# Patient Record
Sex: Female | Born: 1975 | Race: White | Hispanic: No | Smoking: Never smoker
Health system: Southern US, Community
[De-identification: ages and names within clinical notes are randomized; demographics above are authoritative.]

## PROBLEM LIST (undated history)

## (undated) DIAGNOSIS — K219 Gastro-esophageal reflux disease without esophagitis: Secondary | ICD-10-CM

## (undated) DIAGNOSIS — E781 Pure hyperglyceridemia: Secondary | ICD-10-CM

## (undated) DIAGNOSIS — F419 Anxiety disorder, unspecified: Secondary | ICD-10-CM

## (undated) DIAGNOSIS — K589 Irritable bowel syndrome without diarrhea: Secondary | ICD-10-CM

## (undated) DIAGNOSIS — G43909 Migraine, unspecified, not intractable, without status migrainosus: Secondary | ICD-10-CM

## (undated) DIAGNOSIS — N390 Urinary tract infection, site not specified: Secondary | ICD-10-CM

## (undated) DIAGNOSIS — E782 Mixed hyperlipidemia: Secondary | ICD-10-CM

## (undated) DIAGNOSIS — K859 Acute pancreatitis without necrosis or infection, unspecified: Secondary | ICD-10-CM

## (undated) DIAGNOSIS — G2581 Restless legs syndrome: Secondary | ICD-10-CM

## (undated) DIAGNOSIS — D649 Anemia, unspecified: Secondary | ICD-10-CM

## (undated) DIAGNOSIS — R000 Tachycardia, unspecified: Secondary | ICD-10-CM

## (undated) DIAGNOSIS — E785 Hyperlipidemia, unspecified: Secondary | ICD-10-CM

## (undated) HISTORY — DX: Tachycardia, unspecified: R00.0

## (undated) HISTORY — DX: Mixed hyperlipidemia: E78.2

## (undated) HISTORY — DX: Hyperlipidemia, unspecified: E78.5

## (undated) HISTORY — DX: Migraine, unspecified, not intractable, without status migrainosus: G43.909

## (undated) HISTORY — DX: Gastro-esophageal reflux disease without esophagitis: K21.9

## (undated) HISTORY — DX: Pure hyperglyceridemia: E78.1

## (undated) HISTORY — DX: Urinary tract infection, site not specified: N39.0

## (undated) HISTORY — PX: OTHER SURGICAL HISTORY: SHX169

## (undated) HISTORY — DX: Anemia, unspecified: D64.9

## (undated) HISTORY — DX: Anxiety disorder, unspecified: F41.9

## (undated) HISTORY — PX: ANAL SPHINCTEROPLASTY: SUR1305

## (undated) HISTORY — DX: Restless legs syndrome: G25.81

## (undated) HISTORY — DX: Acute pancreatitis without necrosis or infection, unspecified: K85.90

## (undated) HISTORY — PX: TONSILLECTOMY: SUR1361

## (undated) HISTORY — PX: FOOT SURGERY: SHX648

## (undated) HISTORY — DX: Irritable bowel syndrome without diarrhea: K58.9

---

## 2003-07-23 HISTORY — PX: OTHER SURGICAL HISTORY: SHX169

## 2004-01-07 HISTORY — PX: PERINEOPLASTY: SHX2218

## 2008-02-19 HISTORY — PX: TONSILLECTOMY: SUR1361

## 2013-04-02 HISTORY — PX: ESOPHAGOGASTRODUODENOSCOPY: SHX1529

## 2013-05-21 HISTORY — PX: COLONOSCOPY: SHX174

## 2013-11-26 HISTORY — PX: OTHER SURGICAL HISTORY: SHX169

## 2015-10-09 DIAGNOSIS — N871 Moderate cervical dysplasia: Secondary | ICD-10-CM

## 2015-10-09 HISTORY — DX: Moderate cervical dysplasia: N87.1

## 2016-05-31 DIAGNOSIS — K429 Umbilical hernia without obstruction or gangrene: Secondary | ICD-10-CM | POA: Insufficient documentation

## 2016-05-31 DIAGNOSIS — Z6838 Body mass index (BMI) 38.0-38.9, adult: Secondary | ICD-10-CM | POA: Insufficient documentation

## 2016-05-31 DIAGNOSIS — K4090 Unilateral inguinal hernia, without obstruction or gangrene, not specified as recurrent: Secondary | ICD-10-CM

## 2016-05-31 DIAGNOSIS — Z6841 Body Mass Index (BMI) 40.0 and over, adult: Secondary | ICD-10-CM | POA: Insufficient documentation

## 2016-05-31 DIAGNOSIS — E669 Obesity, unspecified: Secondary | ICD-10-CM | POA: Insufficient documentation

## 2016-05-31 HISTORY — DX: Unilateral inguinal hernia, without obstruction or gangrene, not specified as recurrent: K40.90

## 2016-05-31 HISTORY — DX: Body mass index (BMI) 38.0-38.9, adult: Z68.38

## 2016-05-31 HISTORY — DX: Umbilical hernia without obstruction or gangrene: K42.9

## 2016-05-31 HISTORY — DX: Body Mass Index (BMI) 40.0 and over, adult: Z684

## 2016-06-30 HISTORY — PX: OTHER SURGICAL HISTORY: SHX169

## 2016-06-30 HISTORY — PX: UMBILICAL HERNIA REPAIR: SHX196

## 2016-06-30 HISTORY — PX: HERNIA REPAIR: SHX51

## 2017-10-26 ENCOUNTER — Telehealth: Payer: Self-pay | Admitting: Gastroenterology

## 2017-10-26 NOTE — Telephone Encounter (Signed)
Pl do  6 refills She is taking it once a day. Also please inquire about the gallbladder status - please make sure that she did not have cholecystectomy (

## 2017-10-26 NOTE — Telephone Encounter (Signed)
Would you like to give refills?  

## 2017-10-27 MED ORDER — ELUXADOLINE 100 MG PO TABS: 100.0000 mg | ORAL_TABLET | Freq: Every day | ORAL | 5 refills | Status: DC

## 2017-10-27 NOTE — Telephone Encounter (Signed)
Sent refill to patients pharmacy. Patient still has her gallbladder.

## 2017-11-22 NOTE — Telephone Encounter (Signed)
Patient states she spoke with pharmacy and medication viberzi was never sent to pharmacy. Pt would like it resent to [facility] and the prescription line is [phone].

## 2017-11-24 MED ORDER — ELUXADOLINE 100 MG PO TABS: 100.0000 mg | ORAL_TABLET | Freq: Every day | ORAL | 3 refills | Status: DC

## 2017-11-24 NOTE — Telephone Encounter (Signed)
Sent refills to patients pharmacy.  

## 2017-11-24 NOTE — Addendum Note (Signed)
Addended by: Karena Addison on: 11/24/2017 01:56 PM   Modules accepted: Orders

## 2017-12-01 ENCOUNTER — Telehealth: Payer: Self-pay | Admitting: Gastroenterology

## 2017-12-12 NOTE — Telephone Encounter (Signed)
Called in refill to patients pharmacy.

## 2018-04-27 ENCOUNTER — Encounter: Payer: Self-pay | Admitting: Gastroenterology

## 2018-05-15 ENCOUNTER — Encounter: Payer: Self-pay | Admitting: Gastroenterology

## 2018-06-16 ENCOUNTER — Encounter: Payer: Self-pay | Admitting: Gastroenterology

## 2018-06-28 ENCOUNTER — Ambulatory Visit: Payer: Commercial Managed Care - PPO | Admitting: Gastroenterology

## 2018-06-28 ENCOUNTER — Encounter: Payer: Self-pay | Admitting: Gastroenterology

## 2018-06-28 ENCOUNTER — Encounter (INDEPENDENT_AMBULATORY_CARE_PROVIDER_SITE_OTHER): Payer: Self-pay

## 2018-06-28 VITALS — BP 132/86 | HR 86 | Ht 68.0 in | Wt 231.4 lb

## 2018-06-28 DIAGNOSIS — Z8371 Family history of colonic polyps: Secondary | ICD-10-CM

## 2018-06-28 DIAGNOSIS — K580 Irritable bowel syndrome with diarrhea: Secondary | ICD-10-CM

## 2018-06-28 MED ORDER — ELUXADOLINE 100 MG PO TABS: 100.0000 mg | ORAL_TABLET | Freq: Two times a day (BID) | ORAL | 0 refills | Status: DC

## 2018-06-28 NOTE — Progress Notes (Signed)
Chief Complaint: FU  Referring Provider:  No ref. provider found      ASSESSMENT AND PLAN;   #1. Diarrhea - likely IBS, could be due to medications like magnesium.  Doubt exocrine pancreatic insufficiency. Neg random Bx for microscopic colitis 05/2013, neg SB Bx for celiac 03/2013  #2. FH of polyps (dad), requiring hemicolectomy at age 43.  Family history of colon cancer GM  #3. H/O Acute pancreatitis d/t hypertriglyceridemia (peak level 01/2017 3511). No DM.  #4.  Fatty liver with normal LFTs.  Plan: - Continiue crestor, fish oil, vascepa 1g po BID. - Encouraged her to continue losing weight gradually.  Has lost 30 pounds since March 2019 - Cut down mag to 200mg /day. Can add Ca 500mg /qd - Proceed with colonoscopy.  I have discussed the risks and benefits.  The risks including risk of perforation requiring laparotomy, bleeding after polypectomy requiring blood transfusions and risks of anesthesia/sedation were discussed.  Rare risks of missing colorectal neoplasms were also discussed.  - Stool for GI pathogen, fecal elastase, WBC. - Viberzi 100mg  po bid (samples given) - If still with problems, empiric trial of pancreatic enzymes or low-dose amitriptyline at night.   HPI:    Robin Reed is a 43 y.o. female  Was doing very well on Viberzi (GB still in) -until insurance company has denied any further approvals. Has been having bowel movements at the frequency of 3 to 4/day but occasionally would get constipated. Has lost over 30 pounds gradually by watching p.o. intake and exercising. No nausea, vomiting, heartburn, regurgitation, odynophagia or dysphagia. Has abdominal bloating Denies having any melena or hematochezia.  Brings in the labs-03/13/2018-hemoglobin 13.6, WBC count 7.7, platelets 287, BUN 15/creatinine 0.9, normal LFTs AST 22, ALT 18.  Triglycerides 399, TSH 4.07. February 2019 hemoglobin 13.5, normal liver function tests, triglycerides 609   Past Medical  History:  Diagnosis Date  . Anemia   . Anxiety   . Elevated cholesterol with high triglycerides   . GERD (gastroesophageal reflux disease)   . Hyperlipidemia   . IBS (irritable bowel syndrome)   . Pancreatitis   . Tachycardia   . UTI (urinary tract infection)     Past Surgical History:  Procedure Laterality Date  . ANAL SPHINCTEROPLASTY    . COLONOSCOPY  05/21/2013   Very minimal sigmoid diverticulosis. Small internal hemorrhoids. Otherwise normal colon to TI.   Marland Kitchen episiotomy repair  07/2003  . ESOPHAGOGASTRODUODENOSCOPY  04/02/2013   Mild gastrtiis. Otherwise normal EGD  . FOOT SURGERY Right   . HERNIA REPAIR  40/98/1191   umbilical and right inginual   . Leep surgery  11/26/2013  . Nasal Surgery    . Right Inguinal Hernia Repair    . Skin Graft mouth    . TONSILLECTOMY      Family History  Problem Relation Age of Onset  . Clotting disorder Mother   . Colonic polyp Father   . Colon cancer Paternal Grandmother   . Esophageal cancer Neg Hx   . Breast cancer Neg Hx     Social History   Tobacco Use  . Smoking status: Never Smoker  . Smokeless tobacco: Never Used  Substance Use Topics  . Alcohol use: Not Currently  . Drug use: Never    Current Outpatient Medications  Medication Sig Dispense Refill  . AMBULATORY NON FORMULARY MEDICATION 1 tablet daily. Colon Health probiotic    . aspirin 81 MG tablet Take 81 mg by mouth at bedtime.    Marland Kitchen  Coenzyme Q10 (COQ10) 100 MG CAPS Take 1 capsule by mouth daily.    . Cyanocobalamin (VITAMIN B-12 IJ) Inject 1,000 mcg as directed every 30 (thirty) days.    Marland Kitchen dexlansoprazole (DEXILANT) 60 MG capsule Take 60 mg by mouth every other day.    . Drospirenone-Ethinyl Estradiol (YAZ PO) Take 3 mg by mouth at bedtime.    Vanessa Kick Ethyl (VASCEPA) 1 g CAPS Take 1 capsule by mouth 2 (two) times daily.    . Loratadine (CLARITIN) 10 MG CAPS Take 1 capsule by mouth daily.    . Magnesium 500 MG TABS Take 500 mg by mouth at bedtime.     .  metoprolol tartrate (LOPRESSOR) 25 MG tablet Take 25 mg by mouth at bedtime.    . Multiple Vitamin (MULTIVITAMIN) tablet Take 1 tablet by mouth daily. With iron    . Red Yeast Rice 600 MG TABS Take 1 tablet by mouth 2 (two) times daily.    . rosuvastatin (CRESTOR) 40 MG tablet Take 40 mg by mouth at bedtime.    . topiramate (TOPAMAX) 50 MG tablet Take 50 mg by mouth at bedtime.    Marland Kitchen VITAMIN D, CHOLECALCIFEROL, PO Take 2,000 Units by mouth daily.    . Eluxadoline (VIBERZI) 100 MG TABS Take 1 tablet (100 mg total) by mouth daily. (Patient not taking: Reported on 06/28/2018) 90 tablet 3   No current facility-administered medications for this visit.     Not on File  Review of Systems:  Constitutional: Denies fever, chills, diaphoresis, appetite change and fatigue.  HEENT: Denies photophobia, eye pain, redness, hearing loss, ear pain, congestion, sore throat, rhinorrhea, sneezing, mouth sores, neck pain, neck stiffness and tinnitus.   Respiratory: Denies SOB, DOE, cough, chest tightness,  and wheezing.   Cardiovascular: Denies chest pain, palpitations and leg swelling.  Genitourinary: Denies dysuria, urgency, frequency, hematuria, flank pain and difficulty urinating.  Musculoskeletal: Denies myalgias, back pain, joint swelling, arthralgias and gait problem.  Skin: No rash.  Neurological: Denies dizziness, seizures, syncope, weakness, light-headedness, numbness and headaches.  Hematological: Denies adenopathy. Easy bruising, personal or family bleeding history  Psychiatric/Behavioral: Has anxiety or depression. Has trouble sleeping     Physical Exam:    BP 132/86   Pulse 86   Ht 5\' 8"  (1.727 m)   Wt 231 lb 6 oz (105 kg)   BMI 35.18 kg/m  Filed Weights   06/28/18 0838  Weight: 231 lb 6 oz (105 kg)   Constitutional:  Well-developed, in no acute distress. Psychiatric: Normal mood and affect. Behavior is normal. HEENT: Pupils normal.  Conjunctivae are normal. No scleral icterus. Neck  supple.  Cardiovascular: Normal rate, regular rhythm. No edema Pulmonary/chest: Effort normal and breath sounds normal. No wheezing, rales or rhonchi. Abdominal: Soft, nondistended. Nontender. Bowel sounds active throughout. There are no masses palpable. No hepatomegaly. Rectal:  defered Neurological: Alert and oriented to person place and time. Skin: Skin is warm and dry. No rashes noted. 25 minutes spent with the patient today. Greater than 50% was spent in counseling and coordination of care with the patient    Carmell Austria, MD 06/28/2018, 9:02 AM  Cc: Dr Jannette Fogo

## 2018-06-28 NOTE — Patient Instructions (Signed)
If you are age 43 or older, your body mass index should be between 23-30. Your Body mass index is 35.18 kg/m. If this is out of the aforementioned range listed, please consider follow up with your Primary Care Provider.  If you are age 12 or younger, your body mass index should be between 19-25. Your Body mass index is 35.18 kg/m. If this is out of the aformentioned range listed, please consider follow up with your Primary Care Provider.   You have been scheduled for a colonoscopy. Please follow written instructions given to you at your visit today.  Please pick up your prep supplies at the pharmacy within the next 1-3 days. If you use inhalers (even only as needed), please bring them with you on the day of your procedure. Your physician has requested that you go to www.startemmi.com and enter the access code given to you at your visit today. This web site gives a general overview about your procedure. However, you should still follow specific instructions given to you by our office regarding your preparation for the procedure.   Decrease magnesium.  Add Calcium once daily,   Please bring your stool specimen back to the lab on the 2nd floor [address].  Thank you,  Dr. Jackquline Denmark

## 2018-06-29 ENCOUNTER — Telehealth: Payer: Self-pay | Admitting: Gastroenterology

## 2018-06-29 NOTE — Telephone Encounter (Signed)
Called and spoke with patient-patient reports she "dropped off my stool sample at [facility] but I was not really sure what all the doctor wanted to test my stool for so I just gave them the order and told them to run whatever they could";  [facility] lab and clarified the orders for them to run the stool specimen for-they stated they did not know exactly what a "GI pathogen panel is for"; they did however have an order in their system for a stool PCR-don't know if that is the exact test the doctor is wanting;  Please be aware the stool sample results may not be exactly what the original order is specified for;

## 2018-06-29 NOTE — Telephone Encounter (Signed)
Bre Can you please make sure that they get GI pathogen PCR, C. Difficile antigen, cultures and WBCs if possible

## 2018-06-29 NOTE — Telephone Encounter (Signed)
Called and spoke with lab tech, Sharyn Lull, at [facility]-clarification of order given and Naome verbalized understanding of information and instructions; results were requested to be faxed to [phone]

## 2018-07-26 ENCOUNTER — Encounter: Payer: Self-pay | Admitting: Gastroenterology

## 2018-08-08 ENCOUNTER — Ambulatory Visit (AMBULATORY_SURGERY_CENTER): Payer: Commercial Managed Care - PPO | Admitting: Gastroenterology

## 2018-08-08 ENCOUNTER — Encounter: Payer: Self-pay | Admitting: Gastroenterology

## 2018-08-08 VITALS — BP 151/89 | HR 77 | Temp 99.1°F | Resp 16 | Ht 68.0 in | Wt 231.0 lb

## 2018-08-08 DIAGNOSIS — K589 Irritable bowel syndrome without diarrhea: Secondary | ICD-10-CM | POA: Diagnosis not present

## 2018-08-08 DIAGNOSIS — D123 Benign neoplasm of transverse colon: Secondary | ICD-10-CM

## 2018-08-08 DIAGNOSIS — R197 Diarrhea, unspecified: Secondary | ICD-10-CM

## 2018-08-08 DIAGNOSIS — D126 Benign neoplasm of colon, unspecified: Secondary | ICD-10-CM

## 2018-08-08 MED ORDER — SODIUM CHLORIDE 0.9 % IV SOLN: 500.0000 mL | Freq: Once | INTRAVENOUS | Status: DC

## 2018-08-08 NOTE — Progress Notes (Signed)
Report to PACU, RN, vss, BBS= Clear.  

## 2018-08-08 NOTE — Op Note (Signed)
[facility] Patient Name: Robin Reed Procedure Date: 08/08/2018 8:01 AM MRN: [id number] Endoscopist: Jackquline Denmark , MD Age: 43 Referring MD:  Date of Birth: 16-Aug-1975 Gender: Female Account #: [id number] Procedure:                Colonoscopy Indications:              1. Diarrhea. 2. Family history of colonic polyps Medicines:                Monitored Anesthesia Care Procedure:                Pre-Anesthesia Assessment:                           - Prior to the procedure, a History and Physical                            was performed, and patient medications and                            allergies were reviewed. The patient's tolerance of                            previous anesthesia was also reviewed. The risks                            and benefits of the procedure and the sedation                            options and risks were discussed with the patient.                            All questions were answered, and informed consent                            was obtained. Prior Anticoagulants: The patient has                            taken no previous anticoagulant or antiplatelet                            agents. ASA Grade Assessment: I - A normal, healthy                            patient. After reviewing the risks and benefits,                            the patient was deemed in satisfactory condition to                            undergo the procedure.                           After obtaining informed consent, the colonoscope  was passed under direct vision. Throughout the                            procedure, the patient's blood pressure, pulse, and                            oxygen saturations were monitored continuously. The                            Colonoscope was introduced through the anus and                            advanced to the 4 cm into the ileum. The                            colonoscopy was performed without  difficulty. The                            patient tolerated the procedure well. The quality                            of the bowel preparation was excellent. The colon                            was somewhat redundant. Scope In: 8:05:57 AM Scope Out: 8:22:43 AM Scope Withdrawal Time: 0 hours 12 minutes 8 seconds  Total Procedure Duration: 0 hours 16 minutes 46 seconds  Findings:                 A 10 mm polyp was found in the distal transverse                            colon. The polyp was semi-pedunculated. The polyp                            was removed with a hot snare. Resection and                            retrieval were complete. Estimated blood loss:                            none. Biopsies for histology were taken with a cold                            forceps for evaluation of microscopic colitis.                            Estimated blood loss: none.                           Non-bleeding internal hemorrhoids were found during                            retroflexion. The hemorrhoids were small.  The terminal ileum appeared normal. Biopsies were                            taken with a cold forceps for histology. Estimated                            blood loss: none.                           The exam was otherwise without abnormality on                            direct and retroflexion views. Complications:            No immediate complications. Estimated Blood Loss:     Estimated blood loss: none. Impression:               -Colonic polyp status post polypectomy.                           -Small internal hemorrhoids.                           -Otherwise normal colonoscopy to TI. The colon was                            somewhat redundant. Recommendation:           - Patient has a contact number available for                            emergencies. The signs and symptoms of potential                            delayed complications were discussed  with the                            patient. Return to normal activities tomorrow.                            Written discharge instructions were provided to the                            patient.                           - Resume previous diet.                           - Continue present medications.                           - Await pathology results.                           - Repeat colonoscopy for surveillance based on  pathology results.                           - Return to GI clinic in 12 weeks. Jackquline Denmark, MD 08/08/2018 8:27:34 AM This report has been signed electronically.

## 2018-08-09 ENCOUNTER — Telehealth: Payer: Self-pay | Admitting: *Deleted

## 2018-08-09 NOTE — Telephone Encounter (Signed)
  Follow up Call-  Call back number 08/08/2018  Post procedure Call Back phone  # [phone]  Permission to leave phone message Yes     Patient questions:  Do you have a fever, pain , or abdominal swelling? No. Pain Score  0 *  Have you tolerated food without any problems? Yes.    Have you been able to return to your normal activities? Yes.    Do you have any questions about your discharge instructions: Diet   No. Medications  No. Follow up visit  No.  Do you have questions or concerns about your Care? No.  Actions: * If pain score is 4 or above: No action needed, pain <4.

## 2018-08-09 NOTE — Patient Instructions (Signed)
Handout on polyps and hemorrhoids given.  Resume previous medications.     YOU HAD AN ENDOSCOPIC PROCEDURE TODAY AT [facility]:   Refer to the procedure report that was given to you for any specific questions about what was found during the examination.  If the procedure report does not answer your questions, please call your gastroenterologist to clarify.  If you requested that your care partner not be given the details of your procedure findings, then the procedure report has been included in a sealed envelope for you to review at your convenience later.  YOU SHOULD EXPECT: Some feelings of bloating in the abdomen. Passage of more gas than usual.  Walking can help get rid of the air that was put into your GI tract during the procedure and reduce the bloating. If you had a lower endoscopy (such as a colonoscopy or flexible sigmoidoscopy) you may notice spotting of blood in your stool or on the toilet paper. If you underwent a bowel prep for your procedure, you may not have a normal bowel movement for a few days.  Please Note:  You might notice some irritation and congestion in your nose or some drainage.  This is from the oxygen used during your procedure.  There is no need for concern and it should clear up in a day or so.  SYMPTOMS TO REPORT IMMEDIATELY:   Following lower endoscopy (colonoscopy or flexible sigmoidoscopy):  Excessive amounts of blood in the stool  Significant tenderness or worsening of abdominal pains  Swelling of the abdomen that is new, acute  Fever of 100F or higher   For urgent or emergent issues, a gastroenterologist can be reached at any hour by calling [phone].   DIET:  We do recommend a small meal at first, but then you may proceed to your regular diet.  Drink plenty of fluids but you should avoid alcoholic beverages for 24 hours.  ACTIVITY:  You should plan to take it easy for the rest of today and you should NOT DRIVE or use heavy  machinery until tomorrow (because of the sedation medicines used during the test).    FOLLOW UP: Our staff will call the number listed on your records the next business day following your procedure to check on you and address any questions or concerns that you may have regarding the information given to you following your procedure. If we do not reach you, we will leave a message.  However, if you are feeling well and you are not experiencing any problems, there is no need to return our call.  We will assume that you have returned to your regular daily activities without incident.  If any biopsies were taken you will be contacted by phone or by letter within the next 1-3 weeks.  Please call us at [phone] if you have not heard about the biopsies in 3 weeks.    SIGNATURES/CONFIDENTIALITY: You and/or your care partner have signed paperwork which will be entered into your electronic medical record.  These signatures attest to the fact that that the information above on your After Visit Summary has been reviewed and is understood.  Full responsibility of the confidentiality of this discharge information lies with you and/or your care-partner.

## 2018-08-11 ENCOUNTER — Encounter: Payer: Self-pay | Admitting: Gastroenterology

## 2018-10-03 ENCOUNTER — Encounter: Payer: Self-pay | Admitting: Gastroenterology

## 2018-10-03 ENCOUNTER — Other Ambulatory Visit: Payer: Self-pay

## 2018-10-03 ENCOUNTER — Telehealth (INDEPENDENT_AMBULATORY_CARE_PROVIDER_SITE_OTHER): Payer: Commercial Managed Care - PPO | Admitting: Gastroenterology

## 2018-10-03 VITALS — Ht 68.0 in | Wt 221.8 lb

## 2018-10-03 DIAGNOSIS — K760 Fatty (change of) liver, not elsewhere classified: Secondary | ICD-10-CM

## 2018-10-03 DIAGNOSIS — Z8719 Personal history of other diseases of the digestive system: Secondary | ICD-10-CM | POA: Diagnosis not present

## 2018-10-03 DIAGNOSIS — R197 Diarrhea, unspecified: Secondary | ICD-10-CM | POA: Diagnosis not present

## 2018-10-03 DIAGNOSIS — D126 Benign neoplasm of colon, unspecified: Secondary | ICD-10-CM | POA: Diagnosis not present

## 2018-10-03 MED ORDER — AMITRIPTYLINE HCL 25 MG PO TABS: 25.0000 mg | ORAL_TABLET | Freq: Every evening | ORAL | 3 refills | Status: DC | PRN

## 2018-10-03 NOTE — Progress Notes (Signed)
Chief Complaint: FU  Referring Provider:  Raelyn Number, MD      ASSESSMENT AND PLAN;   #1. Diarrhea - likely IBS, could be due to medications like magnesium.  Doubt exocrine pancreatic insufficiency. Neg random Bx for microscopic colitis 07/2018, neg SB Bx for celiac 03/2013  #2. H/O TA (colon 07/2016)FH of polyps (dad), requiring hemicolectomy at age 43.  Family history of colon cancer GM  #3. H/O Acute pancreatitis d/t hypertriglyceridemia (peak level 01/2017 3511). No DM.  #4.  Fatty liver with normal LFTs.  Plan: - Continiue crestor, fish oil, vascepa 1g po BID. - Encouraged her to continue losing weight gradually as she has been doing. - FU colonoscopy 07/2021.  Earlier, in case of any red flag symptoms. - Viberzi 100mg  po bid (samples given). We had 75mg  samples. Will mail d/t covid. - low-dose amitriptyline 25mg  po qhs (30, 2 refills) at night.  It will help to sleep as well. - She is to call us in 2 weeks to let us know.    HPI:    Robin Reed is a 43 y.o. female  For follow-up visit Works in [place] at [employer]  Had diarrhea when she gets upset and has anxiety.   Otherwise has been doing better Has lost more wt Was doing very well on Viberzi (GB still in) -until [employer] has denied any further approvals.  It was horribly expensive as well.  She would like [place] to give samples if possible. Has been having bowel movements at the frequency of 3 to 4/day but occasionally would get constipated. Has lost over 15 more pounds gradually by watching p.o. intake and exercising. No nausea, vomiting, heartburn, regurgitation, odynophagia or dysphagia. Has abdominal bloating Denies having any melena or hematochezia.  Most recent triglycerides were 400.  No further pancreatitis.  Previous labs:2019-hemoglobin 13.6, WBC count 7.7, platelets 287, BUN 15/creatinine 0.9, normal LFTs AST 22, ALT 18.  Triglycerides 399, TSH 4.07. February 2019 hemoglobin 13.5,  normal liver function tests, triglycerides 609   Past Medical History:  Diagnosis Date  . Anemia   . Anxiety   . Elevated cholesterol with high triglycerides   . GERD (gastroesophageal reflux disease)   . Hyperlipidemia   . IBS (irritable bowel syndrome)   . Pancreatitis   . Tachycardia   . UTI (urinary tract infection)     Past Surgical History:  Procedure Laterality Date  . ANAL SPHINCTEROPLASTY    . COLONOSCOPY  05/21/2013   Very minimal sigmoid diverticulosis. Small internal hemorrhoids. Otherwise normal colon to TI.   Marland Kitchen episiotomy repair  07/2003  . ESOPHAGOGASTRODUODENOSCOPY  04/02/2013   Mild gastrtiis. Otherwise normal EGD  . FOOT SURGERY Right   . HERNIA REPAIR  28/41/3244   umbilical and right inginual   . Leep surgery  11/26/2013  . Nasal Surgery    . Right Inguinal Hernia Repair    . Skin Graft mouth    . TONSILLECTOMY      Family History  Problem Relation Age of Onset  . Clotting disorder Mother   . Colonic polyp Father   . Colon cancer Paternal Grandmother   . Esophageal cancer Neg Hx   . Breast cancer Neg Hx     Social History   Tobacco Use  . Smoking status: Never Smoker  . Smokeless tobacco: Never Used  Substance Use Topics  . Alcohol use: Not Currently  . Drug use: Never    Current Outpatient Medications  Medication Sig  Dispense Refill  . AMBULATORY NON FORMULARY MEDICATION 1 tablet daily. Colon Health probiotic    . aspirin 81 MG tablet Take 81 mg by mouth at bedtime.    . Coenzyme Q10 (COQ10) 100 MG CAPS Take 1 capsule by mouth daily.    . Cyanocobalamin (VITAMIN B-12 IJ) Inject 1,000 mcg as directed every 30 (thirty) days.    Marland Kitchen dexlansoprazole (DEXILANT) 60 MG capsule Take 60 mg by mouth every other day.    . Drospirenone-Ethinyl Estradiol (YAZ PO) Take 3 mg by mouth at bedtime.    . Eluxadoline (VIBERZI) 100 MG TABS Take 1 tablet (100 mg total) by mouth daily. 90 tablet 3  . Icosapent Ethyl (VASCEPA) 1 g CAPS Take 1 capsule by mouth  2 (two) times daily.    . Loratadine (CLARITIN) 10 MG CAPS Take 1 capsule by mouth daily.    . Magnesium 500 MG TABS Take 250 mg by mouth at bedtime.     . metoprolol tartrate (LOPRESSOR) 25 MG tablet Take 25 mg by mouth at bedtime.    . Multiple Vitamin (MULTIVITAMIN) tablet Take 1 tablet by mouth daily. With iron    . Omega-3 Fatty Acids (FISH OIL) 1000 MG CAPS Take 2 capsules by mouth 2 (two) times daily.    . Red Yeast Rice 600 MG TABS Take 1 tablet by mouth 2 (two) times daily.    . rosuvastatin (CRESTOR) 40 MG tablet Take 40 mg by mouth at bedtime.    . topiramate (TOPAMAX) 50 MG tablet Take 50 mg by mouth at bedtime.    Marland Kitchen VITAMIN D, CHOLECALCIFEROL, PO Take 2,000 Units by mouth daily.     No current facility-administered medications for this visit.     No Known Allergies  Review of Systems:       Physical Exam:    Ht 5\' 8"  (1.727 m)   Wt 221 lb 12.8 oz (100.6 kg)   BMI 33.72 kg/m  Filed Weights   10/03/18 1334  Weight: 221 lb 12.8 oz (100.6 kg)  Not examined since it was a tele-visit.  This service was provided via telemedicine.  The patient was located at home.  The provider was located in office.  The patient did consent to this telephone visit and is aware of possible charges through their insurance for this visit.   Time spent on call: 15 min    Carmell Austria, MD 10/03/2018, 2:28 PM  Cc: Dr Jannette Fogo

## 2018-10-03 NOTE — Patient Instructions (Addendum)
Continiue crestor, fish oil, vascepa 1g po BID   Encouraged her to continue losing weight gradually.  Has lost 30 pounds since March 2019   FU colonoscopy 07/2021   Viberzi 100mg  po bid (samples given). We had 75mg  samples   low-dose amitriptyline 25mg  po qhs (30, 2 refills) at night ( medication sent to pharmacy)  Call us in 2 weeks to let us know how you are  doing    Thank You for choosing [facility]   Dr Lyndel Safe

## 2019-06-27 ENCOUNTER — Other Ambulatory Visit: Payer: Self-pay | Admitting: Gastroenterology

## 2019-06-27 DIAGNOSIS — K580 Irritable bowel syndrome with diarrhea: Secondary | ICD-10-CM

## 2019-06-27 DIAGNOSIS — Z8371 Family history of colonic polyps: Secondary | ICD-10-CM

## 2020-05-14 ENCOUNTER — Ambulatory Visit: Payer: Commercial Managed Care - PPO | Admitting: Gastroenterology

## 2020-05-14 ENCOUNTER — Encounter: Payer: Self-pay | Admitting: Gastroenterology

## 2020-05-14 VITALS — BP 138/80 | HR 83 | Ht 68.0 in | Wt 238.4 lb

## 2020-05-14 DIAGNOSIS — K580 Irritable bowel syndrome with diarrhea: Secondary | ICD-10-CM

## 2020-05-14 DIAGNOSIS — K219 Gastro-esophageal reflux disease without esophagitis: Secondary | ICD-10-CM

## 2020-05-14 NOTE — Progress Notes (Signed)
Chief Complaint: FU  Referring Provider:  Bonnita Nasuti, MD      ASSESSMENT AND PLAN;   #1. Diarrhea - likely IBS, could be due to medications like magnesium.  Doubt exocrine pancreatic insufficiency. Neg random Bx for microscopic colitis 07/2018, neg SB Bx for celiac 03/2013. Viberzi helped but INS stopped paying.  #2. H/O TA (colon 07/2016)FH of polyps (dad), requiring hemicolectomy at age 44.  Family history of colon cancer GM  #3. H/O Acute pancreatitis d/t hypertriglyceridemia (peak level 01/2017 3511). No DM.  #4.  GERD  #5.  Fatty liver with normal LFTs.  Plan: - Continiue crestor, fish oil, vascepa 1g po BID. - Change Dexilant to Protonix $RemoveBef'40mg'pPdNqucHva$  po QD #30, 11 refills (due to insurance reasons) - Encouraged her to continue losing weight gradually as she has been doing. - FU colonoscopy 07/2021.  Earlier, in case of any red flag symptoms    HPI:    Robin Reed is a 44 y.o. female  For follow-up visit Doing well from GI standpoint.  Got denial for Dexilant from INS company-we will try to fill patient assistance forms.  She would like [place] to give prescription for Protonix as well.  Unfortunately, her dad has been diagnosed as having metastatic squamous cell carcinoma of head and neck area.  He is not responding well to chemotherapy.  He has history of heavy smoking.  C/O Had diarrhea when she gets upset and has anxiety.    Was doing very well on Viberzi (GB still in) -until [employer] has denied any further approvals.  It was horribly expensive as well.   Currently having BMs 1-3/day without nocturnal symptoms.  She would occasionally get constipated.  No abdominal pain.  No melena or hematochezia.  Most recent triglycerides were 217.  No further pancreatitis.  She brings in the latest labs from 04/21/2020 showing hemoglobin 13.8, MCV 90, platelets 267.  Her liver tests are all within normal limits with AST 19, a LT 17, alk phos 43.  Her most recent  triglycerides were 217 with normal hemoglobin A1c 5.4, TSH 2.87, B12 362.  Previous labs:2019-hemoglobin 13.6, WBC count 7.7, platelets 287, BUN 15/creatinine 0.9, normal LFTs AST 22, ALT 18.  Triglycerides 399, TSH 4.07. February 2019 hemoglobin 13.5, normal liver function tests, triglycerides 609  Wt Readings from Last 3 Encounters:  05/14/20 238 lb 6 oz (108.1 kg)  10/03/18 221 lb 12.8 oz (100.6 kg)  08/08/18 231 lb (104.8 kg)    Past Medical History:  Diagnosis Date  . Anemia   . Anxiety   . Elevated cholesterol with high triglycerides   . GERD (gastroesophageal reflux disease)   . Hyperlipidemia   . IBS (irritable bowel syndrome)   . Pancreatitis   . Tachycardia   . UTI (urinary tract infection)     Past Surgical History:  Procedure Laterality Date  . ANAL SPHINCTEROPLASTY    . COLONOSCOPY  05/21/2013   Very minimal sigmoid diverticulosis. Small internal hemorrhoids. Otherwise normal colon to TI.   Marland Kitchen episiotomy repair  07/2003  . ESOPHAGOGASTRODUODENOSCOPY  04/02/2013   Mild gastrtiis. Otherwise normal EGD  . FOOT SURGERY Right   . HERNIA REPAIR  85/46/2703   umbilical and right inginual   . Leep surgery  11/26/2013  . Nasal Surgery    . Right Inguinal Hernia Repair    . Skin Graft mouth    . TONSILLECTOMY      Family History  Problem Relation Age of Onset  .  Clotting disorder Mother   . Colonic polyp Father   . Lung cancer Father   . Throat cancer Father   . Cancer Father        Neck   . Colon cancer Paternal Grandmother   . Esophageal cancer Neg Hx   . Breast cancer Neg Hx     Social History   Tobacco Use  . Smoking status: Never Smoker  . Smokeless tobacco: Never Used  Vaping Use  . Vaping Use: Never used  Substance Use Topics  . Alcohol use: Not Currently  . Drug use: Never    Current Outpatient Medications  Medication Sig Dispense Refill  . aspirin 81 MG tablet Take 81 mg by mouth at bedtime.    . Coenzyme Q10 (COQ10) 100 MG CAPS Take 1  capsule by mouth daily.    . cyanocobalamin (,VITAMIN B-12,) 1000 MCG/ML injection Inject 1,000 mcg into the muscle every 30 (thirty) days.    . Cyanocobalamin (VITAMIN B-12 IJ) Inject 1,000 mcg as directed every 30 (thirty) days.    Marland Kitchen dexlansoprazole (DEXILANT) 60 MG capsule Take 60 mg by mouth every other day.    . Drospirenone-Ethinyl Estradiol (YAZ PO) Take 3 mg by mouth at bedtime.    . liraglutide (VICTOZA) 18 MG/3ML SOPN Inject 18 mg into the skin daily. For weight loss started in April 2021    . Loratadine (CLARITIN) 10 MG CAPS Take 1 capsule by mouth daily.    . Magnesium 500 MG TABS Take 250 mg by mouth at bedtime.     . metoprolol tartrate (LOPRESSOR) 25 MG tablet Take 25 mg by mouth at bedtime.    . Multiple Vitamin (MULTIVITAMIN) tablet Take 1 tablet by mouth daily. With iron    . Omega-3 Fatty Acids (FISH OIL) 1000 MG CAPS Take 2 capsules by mouth 2 (two) times daily.    . Potassium 99 MG TABS Take 1 tablet by mouth daily.    . Red Yeast Rice 600 MG TABS Take 1 tablet by mouth 2 (two) times daily.    . rosuvastatin (CRESTOR) 40 MG tablet Take 40 mg by mouth at bedtime.    . topiramate (TOPAMAX) 50 MG tablet Take 50 mg by mouth at bedtime.    Marland Kitchen VITAMIN D, CHOLECALCIFEROL, PO Take 2,000 Units by mouth daily.     No current facility-administered medications for this visit.    No Known Allergies  Review of Systems:       Physical Exam:    BP 138/80   Pulse 83   Ht $R'5\' 8"'Cz$  (1.727 m)   Wt 238 lb 6 oz (108.1 kg)   BMI 36.24 kg/m  Filed Weights   05/14/20 1050  Weight: 238 lb 6 oz (108.1 kg)  Gen: awake, alert, NAD HEENT: anicteric, no pallor CV: RRR, no mrg Pulm: CTA b/l Abd: soft, NT/ND, +BS throughout Ext: no c/c/e Neuro: nonfocal     Carmell Austria, MD 05/14/2020, 11:01 AM  Cc: Dr Jannette Fogo

## 2020-05-14 NOTE — Patient Instructions (Signed)
If you are age 44 or older, your body mass index should be between 23-30. Your Body mass index is 36.24 kg/m. If this is out of the aforementioned range listed, please consider follow up with your Primary Care Provider.  If you are age 11 or younger, your body mass index should be between 19-25. Your Body mass index is 36.24 kg/m. If this is out of the aformentioned range listed, please consider follow up with your Primary Care Provider.   I have given you the patient assistance form for Dexilant, please complete and send back.   Thank you,  Dr. Jackquline Denmark

## 2020-12-05 DIAGNOSIS — R079 Chest pain, unspecified: Secondary | ICD-10-CM | POA: Diagnosis not present

## 2021-01-05 DIAGNOSIS — F419 Anxiety disorder, unspecified: Secondary | ICD-10-CM | POA: Insufficient documentation

## 2021-01-05 DIAGNOSIS — E782 Mixed hyperlipidemia: Secondary | ICD-10-CM | POA: Insufficient documentation

## 2021-01-05 DIAGNOSIS — K859 Acute pancreatitis without necrosis or infection, unspecified: Secondary | ICD-10-CM | POA: Insufficient documentation

## 2021-01-05 DIAGNOSIS — D649 Anemia, unspecified: Secondary | ICD-10-CM | POA: Insufficient documentation

## 2021-01-05 DIAGNOSIS — K219 Gastro-esophageal reflux disease without esophagitis: Secondary | ICD-10-CM | POA: Insufficient documentation

## 2021-01-05 DIAGNOSIS — K580 Irritable bowel syndrome with diarrhea: Secondary | ICD-10-CM

## 2021-01-05 DIAGNOSIS — R000 Tachycardia, unspecified: Secondary | ICD-10-CM | POA: Insufficient documentation

## 2021-01-05 DIAGNOSIS — K589 Irritable bowel syndrome without diarrhea: Secondary | ICD-10-CM | POA: Insufficient documentation

## 2021-01-05 DIAGNOSIS — E785 Hyperlipidemia, unspecified: Secondary | ICD-10-CM | POA: Insufficient documentation

## 2021-01-05 DIAGNOSIS — N390 Urinary tract infection, site not specified: Secondary | ICD-10-CM | POA: Insufficient documentation

## 2021-01-05 HISTORY — DX: Gastro-esophageal reflux disease without esophagitis: K21.9

## 2021-01-05 HISTORY — DX: Irritable bowel syndrome with diarrhea: K58.0

## 2021-01-18 NOTE — Progress Notes (Signed)
Cardiology Office Note:    Date:  01/19/2021   ID:  Robin Reed, DOB June 01, 1976, MRN [id number]  PCP:  Laverle Hobby, NP  Cardiologist:  Shirlee More, MD   Referring MD: Laverle Hobby, NP  ASSESSMENT:    1. Chest pain of uncertain etiology   2. Elevated cholesterol with high triglycerides    PLAN:    In order of problems listed above:  Improved but ongoing continue PPI check cardiac CTA.  Is well controlled on her high intensity statin She has severe hypertriglyceridemia has had pancreatitis associated as well controlled on rosuvastatin and fish oil  Next appointment as needed if her cardiac CTA is normal   Medication Adjustments/Labs and Tests Ordered: Current medicines are reviewed at length with the patient today.  Concerns regarding medicines are outlined above.  No orders of the defined types were placed in this encounter.  No orders of the defined types were placed in this encounter.    Chief Complaint  Patient presents with   Hospitalization Follow-up    11/2020/ chest pain and L arm heaviness     History of Present Illness:    Robin Reed is a 45 y.o. female with a history of hyperlipidemia who is being seen today for the evaluation of chest pain at the request of Laverle Hobby, NP.  She had echocardiogram performed at [facility] 12/05/2020 interpreted by me showed normal left ventricular size function EF 60 to 65% and normal diastolic filling pressure the study was quite technically difficult no significant valvular abnormality was seen.  She has a history of pancreatitis associated with elevated triglycerides level 3511 and hepatic steatosis and has been treated with a high intensity statin and fish oil products.  Lipid profile 04/21/2020 cholesterol 131 LDL 10.6 triglycerides 217.  Review of epic Care Everywhere show no cardiology evaluation or diagnostic testing.  She works in the [employer] and is known to me I  had seen her in June when she had severe chest pain nonexertional precordial unrelated to activity unrelated with rest her EKG was normal and we did an echocardiogram that was normal.  At that time we discussed further evaluation placement of PPI which is helped but she continues to have chest discomfort that occurs intermittently brief nonexertional I will go ahead and do cardiac CTA. No known history of heart disease congenital rheumatic is not having shortness of breath palpitation or syncope Past Medical History:  Diagnosis Date   Anemia    Anxiety    Elevated cholesterol with high triglycerides    GERD (gastroesophageal reflux disease)    Hyperlipidemia    IBS (irritable bowel syndrome)    Pancreatitis    Tachycardia    UTI (urinary tract infection)     Past Surgical History:  Procedure Laterality Date   ANAL SPHINCTEROPLASTY     COLONOSCOPY  05/21/2013   Very minimal sigmoid diverticulosis. Small internal hemorrhoids. Otherwise normal colon to TI.    episiotomy repair  07/2003   ESOPHAGOGASTRODUODENOSCOPY  04/02/2013   Mild gastrtiis. Otherwise normal EGD   FOOT SURGERY Right    HERNIA REPAIR  99991111   umbilical and right inginual    Leep surgery  11/26/2013   Nasal Surgery     Right Inguinal Hernia Repair     Skin Graft mouth     TONSILLECTOMY      Current Medications: Current Meds  Medication Sig   aspirin 81 MG tablet Take 81 mg by mouth at bedtime.  Coenzyme Q10 (COQ10) 100 MG CAPS Take 1 capsule by mouth daily.   cyanocobalamin (,VITAMIN B-12,) 1000 MCG/ML injection Inject 1,000 mcg into the muscle every 30 (thirty) days.   dexlansoprazole (DEXILANT) 60 MG capsule Take 60 mg by mouth every other day.   Drospirenone-Ethinyl Estradiol (YAZ PO) Take 3 mg by mouth at bedtime.   liraglutide (VICTOZA) 18 MG/3ML SOPN Inject 18 mg into the skin daily. For weight loss started in April 2021   Loratadine 10 MG CAPS Take 1 capsule by mouth daily.   Magnesium 500 MG TABS  Take 500 mg by mouth at bedtime.   meloxicam (MOBIC) 15 MG tablet Take 15 mg by mouth daily.   metoprolol tartrate (LOPRESSOR) 25 MG tablet Take 25 mg by mouth 2 (two) times daily.   Multiple Vitamin (MULTIVITAMIN) tablet Take 1 tablet by mouth daily. With iron/ Unknown strenght   Omega-3 Fatty Acids (FISH OIL) 1000 MG CAPS Take 2 capsules by mouth 2 (two) times daily.   Potassium 99 MG TABS Take 1 tablet by mouth daily.   Red Yeast Rice 600 MG TABS Take 1 tablet by mouth 2 (two) times daily.   rosuvastatin (CRESTOR) 40 MG tablet Take 40 mg by mouth at bedtime.   topiramate (TOPAMAX) 50 MG tablet Take 50 mg by mouth at bedtime.   VITAMIN D, CHOLECALCIFEROL, PO Take 2,000 Units by mouth daily.     Allergies:   Patient has no known allergies.   Social History   Socioeconomic History   Marital status: Divorced    Spouse name: Not on file   Number of children: 1   Years of education: Not on file   Highest education level: Not on file  Occupational History   Occupation: CNA  Tobacco Use   Smoking status: Never   Smokeless tobacco: Never  Vaping Use   Vaping Use: Never used  Substance and Sexual Activity   Alcohol use: Not Currently   Drug use: Never   Sexual activity: Not on file  Other Topics Concern   Not on file  Social History Narrative   Not on file   Social Determinants of Health   Financial Resource Strain: Not on file  Food Insecurity: Not on file  Transportation Needs: Not on file  Physical Activity: Not on file  Stress: Not on file  Social Connections: Not on file     Family History: The patient's family history includes Cancer in her father; Clotting disorder in her mother; Colon cancer in her paternal grandmother; Colonic polyp in her father; Lung cancer in her father; Throat cancer in her father. There is no history of Esophageal cancer or Breast cancer.  ROS:   ROS Please see the history of present illness.     All other systems reviewed and are  negative.  EKGs/Labs/Other Studies Reviewed:    The following studies were reviewed today:   EKG:  EKG is  ordered today.  The ekg ordered today is personally reviewed and demonstrates sinus rhythm and remains normal    Physical Exam:    VS:  BP (!) 138/98 (BP Location: Right Arm, Patient Position: Sitting)   Pulse 85   Ht '5\' 8"'$  (1.727 m)   Wt 241 lb (109.3 kg)   SpO2 100%   BMI 36.64 kg/m     Wt Readings from Last 3 Encounters:  01/19/21 241 lb (109.3 kg)  05/14/20 238 lb 6 oz (108.1 kg)  10/03/18 221 lb 12.8 oz (100.6 kg)  GEN:  Well nourished, well developed in no acute distress HEENT: Normal NECK: No JVD; No carotid bruits LYMPHATICS: No lymphadenopathy CARDIAC: RRR, no murmurs, rubs, gallops RESPIRATORY:  Clear to auscultation without rales, wheezing or rhonchi  ABDOMEN: Soft, non-tender, non-distended MUSCULOSKELETAL:  No edema; No deformity  SKIN: Warm and dry NEUROLOGIC:  Alert and oriented x 3 PSYCHIATRIC:  Normal affect     Signed, Shirlee More, MD  01/19/2021 3:17 PM    [facility]

## 2021-01-19 ENCOUNTER — Ambulatory Visit: Payer: Commercial Managed Care - PPO | Admitting: Cardiology

## 2021-01-19 ENCOUNTER — Encounter: Payer: Self-pay | Admitting: Cardiology

## 2021-01-19 ENCOUNTER — Other Ambulatory Visit: Payer: Self-pay

## 2021-01-19 VITALS — BP 138/98 | HR 85 | Ht 68.0 in | Wt 241.0 lb

## 2021-01-19 DIAGNOSIS — E559 Vitamin D deficiency, unspecified: Secondary | ICD-10-CM

## 2021-01-19 DIAGNOSIS — Z713 Dietary counseling and surveillance: Secondary | ICD-10-CM | POA: Insufficient documentation

## 2021-01-19 DIAGNOSIS — E782 Mixed hyperlipidemia: Secondary | ICD-10-CM | POA: Diagnosis not present

## 2021-01-19 DIAGNOSIS — J302 Other seasonal allergic rhinitis: Secondary | ICD-10-CM | POA: Insufficient documentation

## 2021-01-19 DIAGNOSIS — R079 Chest pain, unspecified: Secondary | ICD-10-CM | POA: Diagnosis not present

## 2021-01-19 DIAGNOSIS — G4700 Insomnia, unspecified: Secondary | ICD-10-CM

## 2021-01-19 DIAGNOSIS — H8110 Benign paroxysmal vertigo, unspecified ear: Secondary | ICD-10-CM

## 2021-01-19 DIAGNOSIS — G2581 Restless legs syndrome: Secondary | ICD-10-CM

## 2021-01-19 DIAGNOSIS — D510 Vitamin B12 deficiency anemia due to intrinsic factor deficiency: Secondary | ICD-10-CM | POA: Insufficient documentation

## 2021-01-19 DIAGNOSIS — E781 Pure hyperglyceridemia: Secondary | ICD-10-CM

## 2021-01-19 HISTORY — DX: Pure hyperglyceridemia: E78.1

## 2021-01-19 HISTORY — DX: Insomnia, unspecified: G47.00

## 2021-01-19 HISTORY — DX: Restless legs syndrome: G25.81

## 2021-01-19 HISTORY — DX: Vitamin B12 deficiency anemia due to intrinsic factor deficiency: D51.0

## 2021-01-19 HISTORY — DX: Benign paroxysmal vertigo, unspecified ear: H81.10

## 2021-01-19 HISTORY — DX: Dietary counseling and surveillance: Z71.3

## 2021-01-19 HISTORY — DX: Vitamin D deficiency, unspecified: E55.9

## 2021-01-19 HISTORY — DX: Other seasonal allergic rhinitis: J30.2

## 2021-01-19 NOTE — Patient Instructions (Signed)
Medication Instructions:  Your physician recommends that you continue on your current medications as directed. Please refer to the Current Medication list given to you today.  *If you need a refill on your cardiac medications before your next appointment, please call your pharmacy*   Lab Work: Your physician recommends that you return for lab work in: Within one week of your cardiac CT  BMP  If you have labs (blood work) drawn today and your tests are completely normal, you will receive your results only by: [place] (if you have MyChart) OR A paper copy in the mail If you have any lab test that is abnormal or we need to change your treatment, we will call you to review the results.   Testing/Procedures:   Your cardiac CT will be scheduled at the below location:   [facility] [address] [place], [place] [address] [phone]  If scheduled at [facility], please arrive at the [facility] main entrance (entrance A) of [facility] 30 minutes prior to test start time. Proceed to the [facility] Radiology Department (first floor) to check-in and test prep.  Please follow these instructions carefully (unless otherwise directed):  On the Night Before the Test: Be sure to Drink plenty of water. Do not consume any caffeinated/decaffeinated beverages or chocolate 12 hours prior to your test. Do not take any antihistamines 12 hours prior to your test.  On the Day of the Test: Drink plenty of water until 1 hour prior to the test. Do not eat any food 4 hours prior to the test. You may take your regular medications prior to the test.  Take metoprolol (Lopressor) two hours prior to test. FEMALES- please wear underwire-free bra if available, avoid dresses & tight clothing  After the Test: Drink plenty of water. After receiving IV contrast, you may experience a mild flushed feeling. This is normal. On occasion, you may experience a mild rash up  to 24 hours after the test. This is not dangerous. If this occurs, you can take Benadryl 25 mg and increase your fluid intake. If you experience trouble breathing, this can be serious. If it is severe call 911 IMMEDIATELY. If it is mild, please call our office. If you take any of these medications: Glipizide/Metformin, Avandament, Glucavance, please do not take 48 hours after completing test unless otherwise instructed.  Please allow 2-4 weeks for scheduling of routine cardiac CTs. Some insurance companies require a pre-authorization which may delay scheduling of this test.   For non-scheduling related questions, please contact the cardiac imaging nurse navigator should you have any questions/concerns: Marchia Bond, Cardiac Imaging Nurse Navigator Gordy Clement, Cardiac Imaging Nurse Navigator [facility] Direct Office Dial: [phone]   For scheduling needs, including cancellations and rescheduling, please call [place], [phone].    Follow-Up: At [facility], you and your health needs are our priority.  As part of our continuing mission to provide you with exceptional heart care, we have created designated Provider Care Teams.  These Care Teams include your primary Cardiologist (physician) and Advanced Practice Providers (APPs -  Physician Assistants and Nurse Practitioners) who all work together to provide you with the care you need, when you need it.  We recommend signing up for the patient portal called "MyChart".  Sign up information is provided on this After Visit Summary.  MyChart is used to connect with patients for Virtual Visits (Telemedicine).  Patients are able to view lab/test results, encounter  notes, upcoming appointments, etc.  Non-urgent messages can be sent to your provider as well.   To learn more about what you can do with MyChart, go to [email or web address].    Your next appointment:   As needed  The format for your next  appointment:   In Person  Provider:   Shirlee More, MD   Other Instructions

## 2021-01-30 ENCOUNTER — Telehealth (HOSPITAL_COMMUNITY): Payer: Self-pay | Admitting: Emergency Medicine

## 2021-01-30 ENCOUNTER — Encounter (HOSPITAL_COMMUNITY): Payer: Self-pay

## 2021-01-30 NOTE — Telephone Encounter (Signed)
Reaching out to patient to offer assistance regarding upcoming cardiac imaging study; pt verbalizes understanding of appt date/time, parking situation and where to check in, pre-test NPO status and medications ordered, and verified current allergies; name and call back number provided for further questions should they arise Marchia Bond RN Navigator Cardiac Imaging [facility] [phone] office [phone] cell   Denies iv issues Denies claustro '25mg'$  metoprolol 2 hr prior

## 2021-02-03 ENCOUNTER — Other Ambulatory Visit: Payer: Self-pay

## 2021-02-03 ENCOUNTER — Telehealth: Payer: Self-pay

## 2021-02-03 ENCOUNTER — Encounter (HOSPITAL_COMMUNITY): Payer: Self-pay

## 2021-02-03 ENCOUNTER — Ambulatory Visit (HOSPITAL_COMMUNITY)
Admission: RE | Admit: 2021-02-03 | Discharge: 2021-02-03 | Disposition: A | Discharge status: Home / Self Care | Payer: Commercial Managed Care - PPO | Source: Ambulatory Visit | Attending: Cardiology | Admitting: Cardiology

## 2021-02-03 DIAGNOSIS — Z539 Procedure and treatment not carried out, unspecified reason: Secondary | ICD-10-CM | POA: Insufficient documentation

## 2021-02-03 DIAGNOSIS — R079 Chest pain, unspecified: Secondary | ICD-10-CM | POA: Diagnosis present

## 2021-02-03 MED ORDER — METOPROLOL TARTRATE 5 MG/5ML IV SOLN
5.0000 mg | INTRAVENOUS | Status: DC | PRN
Start: 2021-02-03 — End: 2021-02-04

## 2021-02-03 MED ORDER — METOPROLOL TARTRATE 5 MG/5ML IV SOLN
INTRAVENOUS | Status: AC
  Administered 2021-02-03: 5 mg via INTRAVENOUS
  Filled 2021-02-03: qty 5

## 2021-02-03 MED ORDER — IVABRADINE HCL 7.5 MG PO TABS: 7.5000 mg | ORAL_TABLET | Freq: Once | ORAL | 0 refills | Status: AC

## 2021-02-03 MED ORDER — DILTIAZEM HCL 25 MG/5ML IV SOLN
INTRAVENOUS | Status: AC
  Administered 2021-02-03: 10 mg via INTRAVENOUS
  Filled 2021-02-03: qty 5

## 2021-02-03 MED ORDER — NITROGLYCERIN 0.4 MG SL SUBL: 0.8000 mg | SUBLINGUAL_TABLET | Freq: Once | SUBLINGUAL | Status: DC

## 2021-02-03 MED ORDER — DILTIAZEM HCL 25 MG/5ML IV SOLN: 10.0000 mg | Freq: Once | INTRAVENOUS | Status: AC

## 2021-02-03 MED ORDER — METOPROLOL TARTRATE 100 MG PO TABS: 100.0000 mg | ORAL_TABLET | Freq: Once | ORAL | 0 refills | Status: DC

## 2021-02-03 NOTE — Telephone Encounter (Signed)
-----   Message from Berniece Salines, DO sent at 02/03/2021  4:36 PM EDT ----- Regarding: RE: ct heart Hi Clarise Cruz, I am covering for Dr. Bettina Gavia he is off this week.  Sounds like a great plan we gave her 100 metoprolol and 7.5 for ivabradine.  Lilia Pro, could you please reach out to the patient and let her know the plan.   ----- Message ----- From: Lorenza Evangelist, RN Sent: 02/03/2021   4:28 PM EDT To: Sueanne Margarita, MD, Richardo Priest, MD, # Subject: FW: ct heart                                   Hey Dr. Bettina Gavia,  This patient arrived for her CCTA today and unfortunately her HR was too fast (80s). We gave IV cardizem and IV metoprolol but without change. We aborted todays test and sent her home. Shes willing to try again so I suggest '100mg'$  metoprolol PLUS 7.'5mg'$  or '10mg'$  ivabradine for the next attempt. The patient wears a fitbit and reported shes never been able to control her HR, never seen it lower than 80s.   Please let me know how you'd like to proceed.  Thank you, Marchia Bond ----- Message ----- From: Melony Overly Sent: 01/20/2021  10:20 AM EDT To: Ciro Backer, Tempie Donning, RN, # Subject: ct heart                                       Scheduled 02/03/21 at 4:00    Thanks, [place]

## 2021-02-03 NOTE — Telephone Encounter (Signed)
Left message on patients voicemail to please return our call.   

## 2021-02-03 NOTE — Telephone Encounter (Signed)
Spoke to the patient just now and let her know the recommendation for these medications. She verbalizes understanding and thanks me for the call back.

## 2021-02-03 NOTE — Telephone Encounter (Signed)
Patient returning call.

## 2021-02-03 NOTE — Progress Notes (Signed)
Patient at radiology department for Cardiac CT scan. Patient took '25mg'$  of metoprolol as directed (daily home dose). On arrival heart rate in 80s. IV Cardizem and IV metoprolol given per protocol. Patient heart rate in upper 70s-80s after medication and heart rate does not respond to breath hold. Dr. Radford Pax made aware of situation and decision made to cancel scheduled scan for today and reschedule with additional premeds.  CT heart navigator nurse Marchia Bond spoke with patient and informed patient of cancellation. Patient states understanding.

## 2021-02-06 ENCOUNTER — Telehealth: Payer: Self-pay | Admitting: Cardiology

## 2021-02-06 MED ORDER — IVABRADINE HCL 7.5 MG PO TABS: 7.5000 mg | ORAL_TABLET | Freq: Once | ORAL | 0 refills | Status: AC

## 2021-02-06 MED ORDER — IVABRADINE HCL 7.5 MG PO TABS: 7.5000 mg | ORAL_TABLET | Freq: Once | ORAL | 0 refills | Status: DC

## 2021-02-06 NOTE — Telephone Encounter (Signed)
  Robin Reed with [facility] called, she said they dont carry Corlanor, unless changing the prescription or try to bigger pharmacy [facility]

## 2021-02-06 NOTE — Addendum Note (Signed)
Addended by: Resa Miner I on: 02/06/2021 02:56 PM   Modules accepted: Orders

## 2021-02-06 NOTE — Telephone Encounter (Signed)
[facility] just now and they told me that they do have this medication in stock right now. I discussed this with the patient and she said she is fine going there to get it. I called this in for her.    Encouraged patient to call back with any questions or concerns.

## 2021-02-06 NOTE — Telephone Encounter (Signed)
Called the pharmacy just now and they let me know that they are unable to fill this medication since it is just one tablet. They can only fill long-term prescriptions. I called the patient and let her know this. We sent it in to [facility] at this time and the patient will let us know if she has any issues getting it from them.    Encouraged patient to call back with any questions or concerns.

## 2021-02-06 NOTE — Telephone Encounter (Signed)
  Pt c/o medication issue:  1. Name of Medication: IVABRADINE Corlanor  2. How are you currently taking this medication (dosage and times per day)?   3. Are you having a reaction (difficulty breathing--STAT)?   4. What is your medication issue? Pt said when she tried to pick this prescription [facility] she was told they cant give her just 1 pill it needs to be a whole prescription. She said [facility] needs auth from Dr. Bettina Gavia

## 2021-02-09 ENCOUNTER — Telehealth (HOSPITAL_COMMUNITY): Payer: Self-pay | Admitting: Emergency Medicine

## 2021-02-09 NOTE — Telephone Encounter (Signed)
Reaching out to patient to offer assistance regarding upcoming cardiac imaging study; pt verbalizes understanding of appt date/time, parking situation and where to check in, pre-test NPO status and medications ordered, and verified current allergies; name and call back number provided for further questions should they arise Marchia Bond RN Navigator Cardiac Imaging [facility] [phone] office [phone] cell   This is patients 2nd attempt to CCTA ( unable to control HR first time, took '100mg'$  metop tart)  This time shes taking '100mg'$  metop tart+ 7.'5mg'$  ivabradine 2 hr prior to scan (plus daily medications)

## 2021-02-11 ENCOUNTER — Ambulatory Visit (HOSPITAL_COMMUNITY)
Admission: RE | Admit: 2021-02-11 | Discharge: 2021-02-11 | Disposition: A | Discharge status: Home / Self Care | Payer: Commercial Managed Care - PPO | Source: Ambulatory Visit | Attending: Cardiology | Admitting: Cardiology

## 2021-02-11 ENCOUNTER — Other Ambulatory Visit: Payer: Self-pay

## 2021-02-11 DIAGNOSIS — R079 Chest pain, unspecified: Secondary | ICD-10-CM

## 2021-02-11 MED ORDER — IOHEXOL 350 MG/ML SOLN
75.0000 mL | Freq: Once | INTRAVENOUS | Status: AC | PRN
  Administered 2021-02-11: 75 mL via INTRAVENOUS

## 2021-02-11 MED ORDER — DILTIAZEM HCL 25 MG/5ML IV SOLN
INTRAVENOUS | Status: AC
  Filled 2021-02-11: qty 5

## 2021-02-11 MED ORDER — METOPROLOL TARTRATE 5 MG/5ML IV SOLN
INTRAVENOUS | Status: AC
  Administered 2021-02-11: 10 mg via INTRAVENOUS
  Filled 2021-02-11: qty 10

## 2021-02-11 MED ORDER — DILTIAZEM HCL 25 MG/5ML IV SOLN: 5.0000 mg | INTRAVENOUS | Status: DC | PRN

## 2021-02-11 MED ORDER — NITROGLYCERIN 0.4 MG SL SUBL
0.8000 mg | SUBLINGUAL_TABLET | Freq: Once | SUBLINGUAL | Status: AC
  Administered 2021-02-11: 0.8 mg via SUBLINGUAL

## 2021-02-11 MED ORDER — METOPROLOL TARTRATE 5 MG/5ML IV SOLN: 5.0000 mg | INTRAVENOUS | Status: DC | PRN

## 2021-02-11 MED ORDER — NITROGLYCERIN 0.4 MG SL SUBL
SUBLINGUAL_TABLET | SUBLINGUAL | Status: AC
  Filled 2021-02-11: qty 2

## 2021-04-30 ENCOUNTER — Telehealth: Payer: Self-pay

## 2021-04-30 NOTE — Telephone Encounter (Addendum)
Spoke with the patient regarding Re- Enrollment  Reminder letter received from [employer] for her [place]. States she is interested to renew it and will pick up the [place] assistance form on her upcoming appt on 05/13/21.

## 2021-05-13 ENCOUNTER — Encounter: Payer: Self-pay | Admitting: Gastroenterology

## 2021-05-13 ENCOUNTER — Other Ambulatory Visit: Payer: Self-pay

## 2021-05-13 ENCOUNTER — Ambulatory Visit (INDEPENDENT_AMBULATORY_CARE_PROVIDER_SITE_OTHER): Payer: Commercial Managed Care - PPO | Admitting: Gastroenterology

## 2021-05-13 VITALS — BP 140/88 | HR 80 | Ht 68.0 in | Wt 237.4 lb

## 2021-05-13 DIAGNOSIS — Z86010 Personal history of colonic polyps: Secondary | ICD-10-CM

## 2021-05-13 DIAGNOSIS — Z800 Family history of malignant neoplasm of digestive organs: Secondary | ICD-10-CM

## 2021-05-13 DIAGNOSIS — K219 Gastro-esophageal reflux disease without esophagitis: Secondary | ICD-10-CM

## 2021-05-13 DIAGNOSIS — K760 Fatty (change of) liver, not elsewhere classified: Secondary | ICD-10-CM

## 2021-05-13 DIAGNOSIS — R197 Diarrhea, unspecified: Secondary | ICD-10-CM | POA: Diagnosis not present

## 2021-05-13 DIAGNOSIS — Z8371 Family history of colonic polyps: Secondary | ICD-10-CM

## 2021-05-13 MED ORDER — DICYCLOMINE HCL 10 MG PO CAPS: 10.0000 mg | ORAL_CAPSULE | Freq: Two times a day (BID) | ORAL | 2 refills | Status: DC

## 2021-05-13 NOTE — Patient Instructions (Addendum)
If you are age 46 or older, your body mass index should be between 23-30. Your Body mass index is 36.09 kg/m. If this is out of the aforementioned range listed, please consider follow up with your Primary Care Provider.  If you are age 54 or younger, your body mass index should be between 19-25. Your Body mass index is 36.09 kg/m. If this is out of the aformentioned range listed, please consider follow up with your Primary Care Provider.   ________________________________________________________  The [facility] providers would like to encourage you to use [facility] to communicate with providers for non-urgent requests or questions.  Due to long hold times on the telephone, sending your provider a message by [facility] may be a faster and more efficient way to get a response.  Please allow 48 business hours for a response.  Please remember that this is for non-urgent requests.  _______________________________________________________  We have sent the following medications to your pharmacy for you to pick up at your convenience: Bentyl  You have been scheduled for a colonoscopy. Please follow written instructions given to you at your visit today.  Please pick up your prep supplies at the pharmacy within the next 1-3 days. If you use inhalers (even only as needed), please bring them with you on the day of your procedure.  Lactose-Free Diet, Adult If you have lactose intolerance, you are not able to digest lactose. Lactose is a natural sugar found mainly in dairy milk and dairy products. A lactose-free diet can help you avoid foods and beverages that contain lactose. What are tips for following this plan? Reading food labels Do not consume foods, beverages, vitamins, minerals, or medicines containing lactose. Read ingredient lists carefully. Look for the words "lactose-free" on labels. Meal planning Use alternatives to dairy milk and foods made with milk products. These include the  following: Lactose-free milk. Soy milk with added calcium and vitamin D. Almond milk, coconut milk, rice milk, or other nondairy milk alternatives with added calcium and vitamin D. Note that a lot of these are low in protein. Soy products, such as soy yogurt, soy cheese, soy ice cream, and soy-based sour cream. Other nut milk products, such as almond yogurt, almond cheese, cashew yogurt, cashew cheese, cashew ice cream, coconut yogurt, and coconut ice cream. Medicines, vitamins, and supplements Use lactase enzyme drops or tablets as directed by your health care provider. Make sure you get enough calcium and vitamin D in your diet. A lactose-free eating plan can be lacking in these important nutrients. Take calcium and vitamin D supplements as directed by your health care provider. Talk with your health care provider about supplements if you are not able to get enough calcium and vitamin D from food. What foods should I eat? Fruits All fresh, canned, frozen, or dried fruits and fruit juices that are not processed with lactose. Vegetables All fresh, frozen, and canned vegetables without cheese, cream, or butter sauces. Grains Any that are not made with dairy milk or dairy products. Meats and other proteins Any meat, fish, poultry, and other protein sources that are not made with dairy milk or dairy products. Fats and oils Any that are not made with dairy milk or dairy products. Sweets and desserts Any that are not made with dairy milk or dairy products. Seasonings and condiments Any that are not made with dairy milk or dairy products. Calcium Calcium is found in many foods that contain lactose and is important for bone health. The amount of calcium you  need depends on your age: Adults younger than 50 years: 1,000 mg of calcium a day. Adults older than 50 years: 1,200 mg of calcium a day. If you are not getting enough calcium, you may get it from other sources, including: Orange juice that  has been fortified with calcium. This means that calcium has been added to the product. There are 300-350 mg of calcium in 1 cup (237 mL) of calcium-fortified orange juice. Soy milk fortified with calcium. There are 300-400 mg of calcium in 1 cup (237 mL) of calcium-fortified soy milk. Rice or almond milk fortified with calcium. There are 300 mg of calcium in 1 cup (237 mL) of calcium-fortified rice or almond milk. Breakfast cereals fortified with calcium. There are 100-1,000 mg of calcium in calcium-fortified breakfast cereals. Spinach, cooked. There are 145 mg of calcium in  cup (90 g) of cooked spinach. Edamame, cooked. There are 130 mg of calcium in  cup (47 g) of cooked edamame. Collard greens, cooked. There are 125 mg of calcium in  cup (85 g) of cooked collard greens. Kale, frozen or cooked. There are 90 mg of calcium in  cup (59 g) of cooked or frozen kale. Almonds. There are 95 mg of calcium in  cup (35 g) of almonds. Broccoli, cooked. There are 60 mg of calcium in 1 cup (156 g) of cooked broccoli. The items listed above may not be a complete list of foods and beverages you can eat. Contact a dietitian for more options. What foods should I avoid? Lactose is found in dairy milk and dairy products, such as: Yogurt. Cheese. Butter. Margarine. Sour cream. Cream. Whipped toppings and creamers. Ice cream and other dairy-based desserts. Lactose is also found in foods or products made with dairy milk or milk ingredients. To find out whether a food contains dairy milk or a milk ingredient, look at the ingredients list. Avoid foods with the statement "May contain milk" and foods that contain: Milk powder. Whey. Curd. Lactose. Lactoglobulin. The items listed above may not be a complete list of foods and beverages to avoid. Contact a dietitian for more information. Where to find more information [employer] of Diabetes and Digestive and Kidney Diseases:  [email or web address] Summary If you are lactose intolerant, it means that you are not able to digest lactose, a natural sugar found in milk and milk products. Following a lactose-free diet can help you manage this condition. Calcium is important for bone health and is found in many foods that contain lactose. Talk with your health care provider about other sources of calcium. This information is not intended to replace advice given to you by your health care provider. Make sure you discuss any questions you have with your health care provider. Document Revised: 05/13/2020 Document Reviewed: 05/13/2020 Elsevier Patient Education  2022 [employer].

## 2021-05-13 NOTE — Progress Notes (Signed)
Chief Complaint: FU  Referring Provider:  Laverle Hobby, NP      ASSESSMENT AND PLAN;   #1. Diarrhea - likely IBS-D with bloating, meds like Mg contributing.  Doubt exocrine pancreatic insufficiency. Neg random Bx for microscopic colitis Aug 12, 2018, neg SB Bx for celiac 03/2013. Viberzi helped but INS stopped paying.  #2. H/O TA (colon 08-12-2016). FH of polyps (dad), requiring hemicolectomy at age 45.  FH of colon cancer GM  #3. H/O Acute pancreatitis d/t hypertriglyceridemia (peak level 01/2017 3511). No DM. GB intact  #4.  GERD  #5.  Fatty liver with normal LFTs.  Plan:  - Dexilant 60 mg po QD #30, 11 refills - Encouraged her to continue losing weight gradually as she has been doing. - Colon 08-12-2021 with miralax. - Lactose free diet x 2 weeks - Bentyl 24m po BID 1/2 hrs before meals #60, 2 refills    Discussed risks & benefits of colonoscopy. Risks including rare perforation req laparotomy, bleeding after bx/polypectomy req blood transfusion, rarely missing neoplasms, risks of anesthesia/sedation, rare risk of damage to internal organs. Benefits outweigh the risks. Patient agrees to proceed. All the questions were answered. Pt consents to proceed. HPI:    Robin Macmullenis a 45y.o. female  [place] follow[place]up visit [place].    Does not have any upper GI symptoms as long as she takes Dexilant every other day.  We filled up forms for patient assistance today.  Still has intermittent diarrhea especially after eating.  At times it does "last the whole day".  She continues to be on magnesium supplements as it has helped her with muscle cramps.  She does occasionally get constipated as well.  She recently had blood work and told me that she had normal CBC and CMP.  Her triglycerides were 133 on fenofibrate.  She has lost weight intentionally.  No further pancreatitis.  No abdominal pain.  Does complain of abdominal bloating.  More so  when she eats cheese.     Previous labs from 04/21/2020 showing hemoglobin 13.8, MCV 90, platelets 267.  Her liver tests are all within normal limits with AST 19, a LT 17, alk phos 43.  Her most recent triglycerides were 217 with normal hemoglobin A1c 5.4, TSH 2.87, B12 362. Baseline labs from 2019-hemoglobin 13.6, WBC count 7.7, platelets 287, BUN 15/creatinine 0.9, normal LFTs AST 22, ALT 18.  Triglycerides 399, TSH 4.07. F22-Feb-2019hemoglobin 13.5, normal liver function tests, triglycerides 609  SH- dad passed away f02/22/2022/t metastatic squamous cell CA of head and neck.  He did smoke a lot.  Wt Readings from Last 3 Encounters:  05/13/21 237 lb 6 oz (107.7 kg)  01/19/21 241 lb (109.3 kg)  05/14/20 238 lb 6 oz (108.1 kg)    Past Medical History:  Diagnosis Date   Anemia    Anxiety    Elevated cholesterol with high triglycerides    GERD (gastroesophageal reflux disease)    Hyperlipidemia    IBS (irritable bowel syndrome)    Pancreatitis    Tachycardia    UTI (urinary tract infection)     Past Surgical History:  Procedure Laterality Date   ANAL SPHINCTEROPLASTY     COLONOSCOPY  05/21/2013   Very minimal sigmoid diverticulosis. Small internal hemorrhoids. Otherwise normal colon to TI.    episiotomy repair  0Feb 22, 2005  ESOPHAGOGASTRODUODENOSCOPY  04/02/2013   Mild gastrtiis. Otherwise normal EGD   FOOT SURGERY Right  HERNIA REPAIR  16/03/9603   umbilical and right inginual    Leep surgery  11/26/2013   Nasal Surgery     Right Inguinal Hernia Repair     Skin Graft mouth     TONSILLECTOMY      Family History  Problem Relation Age of Onset   Clotting disorder Mother    Colonic polyp Father    Lung cancer Father    Throat cancer Father    Cancer Father        Neck    Colon cancer Paternal Grandmother    Esophageal cancer Neg Hx    Breast cancer Neg Hx     Social History   Tobacco Use   Smoking status: Never   Smokeless tobacco: Never  Vaping Use    Vaping Use: Never used  Substance Use Topics   Alcohol use: Not Currently   Drug use: Never    Current Outpatient Medications  Medication Sig Dispense Refill   aspirin 81 MG tablet Take 81 mg by mouth at bedtime.     Coenzyme Q10 (COQ10) 100 MG CAPS Take 1 capsule by mouth daily.     cyanocobalamin (,VITAMIN B-12,) 1000 MCG/ML injection Inject 1,000 mcg into the muscle every 30 (thirty) days.     dexlansoprazole (DEXILANT) 60 MG capsule Take 60 mg by mouth every other day.     Drospirenone-Ethinyl Estradiol (YAZ PO) Take 3 mg by mouth at bedtime.     fenofibrate (TRICOR) 145 MG tablet TAKE ONE TABLET BY MOUTH ONCE DAILY 30     Loratadine 10 MG CAPS Take 1 capsule by mouth daily.     Magnesium 500 MG TABS Take 500 mg by mouth at bedtime.     meloxicam (MOBIC) 15 MG tablet Take 15 mg by mouth daily.     metoprolol tartrate (LOPRESSOR) 25 MG tablet Take 25 mg by mouth 2 (two) times daily.     Multiple Vitamin (MULTIVITAMIN) tablet Take 1 tablet by mouth daily. With iron/ Unknown strenght     Omega-3 Fatty Acids (FISH OIL) 1000 MG CAPS Take 2 capsules by mouth 2 (two) times daily.     Potassium 99 MG TABS Take 1 tablet by mouth daily.     Red Yeast Rice 600 MG TABS Take 1 tablet by mouth 2 (two) times daily.     rOPINIRole (REQUIP) 2 MG tablet 1 tablet 1 to 3 hours before bedtime     rosuvastatin (CRESTOR) 40 MG tablet Take 40 mg by mouth at bedtime.     Ubrogepant (UBRELVY) 100 MG TABS 1 tablet may take second dose at least 2 hours after first dose as needed     venlafaxine XR (EFFEXOR-XR) 37.5 MG 24 hr capsule 1 capsule with food     VITAMIN D, CHOLECALCIFEROL, PO Take 2,000 Units by mouth daily.     No current facility-administered medications for this visit.    No Known Allergies  Review of Systems:       Physical Exam:    BP 140/88   Pulse 80   Ht _0  (1.727 m)   Wt 237 lb 6 oz (107.7 kg)   SpO2 98%   BMI 36.09 kg/m  Filed Weights   05/13/21 0823  Weight: 237 lb 6  oz (107.7 kg)  Gen: awake, alert, NAD HEENT: anicteric, no pallor CV: RRR, no mrg Pulm: CTA b/l Abd: soft, NT/ND, +BS throughout Ext: no c/c/e Neuro: nonfocal     Carmell Austria, MD 05/13/2021,  8:44 AM  Cc: Dr Jannette Fogo

## 2021-05-19 NOTE — Telephone Encounter (Signed)
Received Approval letter from [facility] for her Lane until May 18, 2022.

## 2021-07-31 ENCOUNTER — Other Ambulatory Visit: Payer: Self-pay

## 2021-07-31 ENCOUNTER — Ambulatory Visit (AMBULATORY_SURGERY_CENTER): Payer: Commercial Managed Care - PPO | Admitting: Gastroenterology

## 2021-07-31 ENCOUNTER — Encounter: Payer: Self-pay | Admitting: Gastroenterology

## 2021-07-31 VITALS — BP 129/79 | HR 86 | Temp 98.0°F | Resp 22 | Ht 68.0 in | Wt 237.0 lb

## 2021-07-31 DIAGNOSIS — K5730 Diverticulosis of large intestine without perforation or abscess without bleeding: Secondary | ICD-10-CM | POA: Diagnosis not present

## 2021-07-31 DIAGNOSIS — K640 First degree hemorrhoids: Secondary | ICD-10-CM

## 2021-07-31 DIAGNOSIS — Z800 Family history of malignant neoplasm of digestive organs: Secondary | ICD-10-CM

## 2021-07-31 DIAGNOSIS — R197 Diarrhea, unspecified: Secondary | ICD-10-CM

## 2021-07-31 DIAGNOSIS — Z86010 Personal history of colonic polyps: Secondary | ICD-10-CM

## 2021-07-31 MED ORDER — DICYCLOMINE HCL 10 MG PO CAPS: 10.0000 mg | ORAL_CAPSULE | Freq: Four times a day (QID) | ORAL | 6 refills | Status: DC | PRN

## 2021-07-31 MED ORDER — SODIUM CHLORIDE 0.9 % IV SOLN: 500.0000 mL | Freq: Once | INTRAVENOUS | Status: DC

## 2021-07-31 NOTE — Op Note (Signed)
[facility] Patient Name: Robin Reed Procedure Date: 07/31/2021 8:29 AM MRN: [id number] Endoscopist: Jackquline Denmark , MD Age: 46 Referring MD:  Date of Birth: [birth year] Gender: Female Account #: [id number] Procedure:                Colonoscopy Indications:              Chronic diarrhea. FH of colon polyps (dad at age 6                            had advanced colon polyp s/p right hemicolectomy) Medicines:                Monitored Anesthesia Care Procedure:                Pre-Anesthesia Assessment:                           - Prior to the procedure, a History and Physical                            was performed, and patient medications and                            allergies were reviewed. The patient's tolerance of                            previous anesthesia was also reviewed. The risks                            and benefits of the procedure and the sedation                            options and risks were discussed with the patient.                            All questions were answered, and informed consent                            was obtained. Prior Anticoagulants: The patient has                            taken no previous anticoagulant or antiplatelet                            agents. ASA Grade Assessment: II - A patient with                            mild systemic disease. After reviewing the risks                            and benefits, the patient was deemed in                            satisfactory condition to undergo the procedure.  After obtaining informed consent, the colonoscope                            was passed under direct vision. Throughout the                            procedure, the patient's blood pressure, pulse, and                            oxygen saturations were monitored continuously. The                            CF [id number] #[id number] was introduced through the anus                            and  advanced to the 2 cm into the ileum. The                            colonoscopy was performed without difficulty. The                            patient tolerated the procedure well. The quality                            of the bowel preparation was good. The terminal                            ileum, ileocecal valve, appendiceal orifice, and                            rectum were photographed. Scope In: 8:36:29 AM Scope Out: 8:47:02 AM Scope Withdrawal Time: 0 hours 7 minutes 2 seconds  Total Procedure Duration: 0 hours 10 minutes 33 seconds  Findings:                 The colon (entire examined portion) appeared                            normal. Biopsies for histology were taken with a                            cold forceps from the entire colon for evaluation                            of microscopic colitis.                           A few(2-3) rare small-mouthed diverticula were                            found in the sigmoid colon.                           Non-bleeding internal hemorrhoids were found during  retroflexion. The hemorrhoids were small and Grade                            I (internal hemorrhoids that do not prolapse).                           The terminal ileum appeared normal.                           The exam was otherwise without abnormality on                            direct and retroflexion views. Complications:            No immediate complications. Estimated Blood Loss:     Estimated blood loss: none. Impression:               - The entire examined colon is normal. Biopsied.                           - Minimal sigmoid diverticulosis.                           - Non-bleeding internal hemorrhoids.                           - The examined portion of the ileum was normal.                           - The examination was otherwise normal on direct                            and retroflexion views. Recommendation:           - Patient  has a contact number available for                            emergencies. The signs and symptoms of potential                            delayed complications were discussed with the                            patient. Return to normal activities tomorrow.                            Written discharge instructions were provided to the                            patient.                           - Resume previous diet.                           - Continue present medications. I do believe she  has underlying IBS-D. If still with problems,                            increase Bentyl since it worked well in past.                           - Await pathology results.                           - Repeat colonoscopy in 5 years for screening                            purposes d/t FH of advanced colon polyps.                           - The findings and recommendations were discussed                            with the patient's family. Jackquline Denmark, MD 07/31/2021 8:51:50 AM This report has been signed electronically.

## 2021-07-31 NOTE — Patient Instructions (Signed)
Await pathology results   YOU HAD AN ENDOSCOPIC PROCEDURE TODAY AT THE [facility]:   Refer to the procedure report that was given to you for any specific questions about what was found during the examination.  If the procedure report does not answer your questions, please call your gastroenterologist to clarify.  If you requested that your care partner not be given the details of your procedure findings, then the procedure report has been included in a sealed envelope for you to review at your convenience later.  YOU SHOULD EXPECT: Some feelings of bloating in the abdomen. Passage of more gas than usual.  Walking can help get rid of the air that was put into your GI tract during the procedure and reduce the bloating. If you had a lower endoscopy (such as a colonoscopy or flexible sigmoidoscopy) you may notice spotting of blood in your stool or on the toilet paper. If you underwent a bowel prep for your procedure, you may not have a normal bowel movement for a few days.  Please Note:  You might notice some irritation and congestion in your nose or some drainage.  This is from the oxygen used during your procedure.  There is no need for concern and it should clear up in a day or so.  SYMPTOMS TO REPORT IMMEDIATELY:   Following lower endoscopy (colonoscopy or flexible sigmoidoscopy):  Excessive amounts of blood in the stool  Significant tenderness or worsening of abdominal pains  Swelling of the abdomen that is new, acute  Fever of 100F or higher  For urgent or emergent issues, a gastroenterologist can be reached at any hour by calling [phone]. Do not use MyChart messaging for urgent concerns.    DIET:  We do recommend a small meal at first, but then you may proceed to your regular diet.  Drink plenty of fluids but you should avoid alcoholic beverages for 24 hours.  ACTIVITY:  You should plan to take it easy for the rest of today and you should NOT DRIVE or use heavy  machinery until tomorrow (because of the sedation medicines used during the test).    FOLLOW UP: Our staff will call the number listed on your records 48-72 hours following your procedure to check on you and address any questions or concerns that you may have regarding the information given to you following your procedure. If we do not reach you, we will leave a message.  We will attempt to reach you two times.  During this call, we will ask if you have developed any symptoms of COVID 19. If you develop any symptoms (ie: fever, flu-like symptoms, shortness of breath, cough etc.) before then, please call [phone].  If you test positive for Covid 19 in the 2 weeks post procedure, please call and report this information to us.    If any biopsies were taken you will be contacted by phone or by letter within the next 1-3 weeks.  Please call us at [phone] if you have not heard about the biopsies in 3 weeks.    SIGNATURES/CONFIDENTIALITY: You and/or your care partner have signed paperwork which will be entered into your electronic medical record.  These signatures attest to the fact that that the information above on your After Visit Summary has been reviewed and is understood.  Full responsibility of the confidentiality of this discharge information lies with you and/or your care-partner. 

## 2021-07-31 NOTE — Progress Notes (Signed)
Called to room to assist during endoscopic procedure.  Patient ID and intended procedure confirmed with present staff. Received instructions for my participation in the procedure from the performing physician.  

## 2021-07-31 NOTE — Progress Notes (Signed)
Pt's states no medical or surgical changes since previsit or office visit. 

## 2021-07-31 NOTE — Progress Notes (Signed)
Chief Complaint: FU  Referring Provider:  Laverle Hobby, NP      ASSESSMENT AND PLAN;   #1. Diarrhea - likely IBS-D with bloating, meds like Mg contributing.  Doubt exocrine pancreatic insufficiency. Neg random Bx for microscopic colitis Aug 04, 2018, neg SB Bx for celiac 03/2013. Viberzi helped but INS stopped paying.  #2. 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 FH of polyps (dad), requiring hemicolectomy at age 46.  FH of colon cancer GM  #3. H/O Acute pancreatitis d/t hypertriglyceridemia (peak level 01/2017 3511). No DM. GB intact  #4.  GERD  #5.  Fatty liver with normal LFTs.  Plan:  - Dexilant 60 mg po QD #30, 11 refills - Encouraged her to continue losing weight gradually as she has been doing. - Colon 08-04-21 with miralax. - Lactose free diet x 2 weeks - Bentyl 26m po BID 1/2 hrs before meals #60, 2 refills    Discussed risks & benefits of colonoscopy. Risks including rare perforation req laparotomy, bleeding after bx/polypectomy req blood transfusion, rarely missing neoplasms, risks of anesthesia/sedation, rare risk of damage to internal organs. Benefits outweigh the risks. Patient agrees to proceed. All the questions were answered. Pt consents to proceed. HPI:    Robin Reed a 46y.o. 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    Does not have any upper GI symptoms as long as she takes Dexilant every other day.  We filled up forms for patient assistance today.  Still has intermittent diarrhea especially after eating.  At times it does "last the whole day".  She continues to be on magnesium supplements as it has helped her with muscle cramps.  She does occasionally get constipated as well.  She recently had blood work and told me that she had normal CBC and CMP.  Her triglycerides were 133 on fenofibrate.  She has lost weight intentionally.  No further pancreatitis.  No abdominal pain.  Does complain of abdominal bloating.  More so  when she eats cheese.     Previous labs from 04/21/2020 showing hemoglobin 13.8, MCV 90, platelets 267.  Her liver tests are all within normal limits with AST 19, a LT 17, alk phos 43.  Her most recent triglycerides were 217 with normal hemoglobin A1c 5.4, TSH 2.87, B12 362. Baseline labs from 2019-hemoglobin 13.6, WBC count 7.7, platelets 287, BUN 15/creatinine 0.9, normal LFTs AST 22, ALT 18.  Triglycerides 399, TSH 4.07. F02-14-2019hemoglobin 13.5, normal liver function tests, triglycerides 609  SH- dad passed away f14-Feb-2022/t metastatic squamous cell CA of head and neck.  He did smoke a lot.  Wt Readings from Last 3 Encounters:  07/31/21 237 lb (107.5 kg)  05/13/21 237 lb 6 oz (107.7 kg)  01/19/21 241 lb (109.3 kg)    Past Medical History:  Diagnosis Date   Anemia    Anxiety    Elevated cholesterol with high triglycerides    GERD (gastroesophageal reflux disease)    Hyperlipidemia    IBS (irritable bowel syndrome)    Pancreatitis    Tachycardia    UTI (urinary tract infection)     Past Surgical History:  Procedure Laterality Date   ANAL SPHINCTEROPLASTY     COLONOSCOPY  05/21/2013   Very minimal sigmoid diverticulosis. Small internal hemorrhoids. Otherwise normal colon to TI.    episiotomy repair  014-Feb-2005  ESOPHAGOGASTRODUODENOSCOPY  04/02/2013   Mild gastrtiis. Otherwise normal EGD   FOOT SURGERY Right  HERNIA REPAIR  41/63/8453   umbilical and right inginual    Leep surgery  11/26/2013   Nasal Surgery     Right Inguinal Hernia Repair     Skin Graft mouth     TONSILLECTOMY      Family History  Problem Relation Age of Onset   Clotting disorder Mother    Colonic polyp Father    Lung cancer Father    Throat cancer Father    Cancer Father        Neck    Colon cancer Paternal Grandmother    Esophageal cancer Neg Hx    Breast cancer Neg Hx     Social History   Tobacco Use   Smoking status: Never   Smokeless tobacco: Never  Vaping Use   Vaping  Use: Never used  Substance Use Topics   Alcohol use: Not Currently   Drug use: Never    Current Outpatient Medications  Medication Sig Dispense Refill   aspirin 81 MG tablet Take 81 mg by mouth at bedtime.     Coenzyme Q10 (COQ10) 100 MG CAPS Take 1 capsule by mouth daily.     cyanocobalamin (,VITAMIN B-12,) 1000 MCG/ML injection Inject 1,000 mcg into the muscle every 30 (thirty) days.     dexlansoprazole (DEXILANT) 60 MG capsule Take 60 mg by mouth every other day.     dicyclomine (BENTYL) 10 MG capsule Take 1 capsule (10 mg total) by mouth 2 (two) times daily. Take 30 minutes before meals 60 capsule 2   Drospirenone-Ethinyl Estradiol (YAZ PO) Take 3 mg by mouth at bedtime.     fenofibrate (TRICOR) 145 MG tablet TAKE ONE TABLET BY MOUTH ONCE DAILY 30     Loratadine 10 MG CAPS Take 1 capsule by mouth daily.     Magnesium 500 MG TABS Take 500 mg by mouth at bedtime.     meloxicam (MOBIC) 15 MG tablet Take 15 mg by mouth daily.     metoprolol tartrate (LOPRESSOR) 25 MG tablet Take 25 mg by mouth 2 (two) times daily.     Multiple Vitamin (MULTIVITAMIN) tablet Take 1 tablet by mouth daily. With iron/ Unknown strenght     Omega-3 Fatty Acids (FISH OIL) 1000 MG CAPS Take 2 capsules by mouth 2 (two) times daily.     Potassium 99 MG TABS Take 1 tablet by mouth daily.     Red Yeast Rice 600 MG TABS Take 1 tablet by mouth 2 (two) times daily.     rOPINIRole (REQUIP) 2 MG tablet 1 tablet 1 to 3 hours before bedtime     rosuvastatin (CRESTOR) 40 MG tablet Take 40 mg by mouth at bedtime.     VITAMIN D, CHOLECALCIFEROL, PO Take 2,000 Units by mouth daily.     Ubrogepant (UBRELVY) 100 MG TABS 1 tablet may take second dose at least 2 hours after first dose as needed     venlafaxine XR (EFFEXOR-XR) 37.5 MG 24 hr capsule 1 capsule with food     Current Facility-Administered Medications  Medication Dose Route Frequency Provider Last Rate Last Admin   0.9 %  sodium chloride infusion  500 mL Intravenous  Once Jackquline Denmark, MD        No Known Allergies  Review of Systems:       Physical Exam:    BP (!) 162/94    Pulse 94    Temp 98 F (36.7 C) (Temporal)    Ht _0  (1.727 m)  Wt 237 lb (107.5 kg)    SpO2 100%    BMI 36.04 kg/m  Filed Weights   07/31/21 0741  Weight: 237 lb (107.5 kg)  Gen: awake, alert, NAD HEENT: anicteric, no pallor CV: RRR, no mrg Pulm: CTA b/l Abd: soft, NT/ND, +BS throughout Ext: no c/c/e Neuro: nonfocal     Carmell Austria, MD 07/31/2021, 8:30 AM  Cc: Dr Jannette Fogo

## 2021-07-31 NOTE — Progress Notes (Signed)
Report given to PACU, vss 

## 2021-08-04 ENCOUNTER — Telehealth: Payer: Self-pay | Admitting: *Deleted

## 2021-08-04 NOTE — Telephone Encounter (Signed)
°  Follow up Call-  Call back number 07/31/2021  Post procedure Call Back phone  # [phone]  Permission to leave phone message Yes  Some recent data might be hidden     Patient questions:  Do you have a fever, pain , or abdominal swelling? No. Pain Score  0 *  Have you tolerated food without any problems? Yes.    Have you been able to return to your normal activities? Yes.    Do you have any questions about your discharge instructions: Diet   No. Medications  No. Follow up visit  No.  Do you have questions or concerns about your Care? No.  Actions: * If pain score is 4 or above: No action needed, pain <4.

## 2021-08-07 ENCOUNTER — Encounter: Payer: Self-pay | Admitting: Gastroenterology

## 2021-08-31 ENCOUNTER — Telehealth: Payer: Self-pay

## 2021-08-31 NOTE — Telephone Encounter (Signed)
Patient returned call, patient has already had procedure.  ?

## 2021-08-31 NOTE — Telephone Encounter (Signed)
I have called patient and left a voicemail for patient to call back and schedule an appointment with the previsit nurse for a recall colonoscopy. ? ?

## 2021-09-14 ENCOUNTER — Encounter: Payer: Self-pay | Admitting: Gastroenterology

## 2022-03-02 ENCOUNTER — Other Ambulatory Visit: Payer: Self-pay | Admitting: Gastroenterology

## 2022-04-16 ENCOUNTER — Telehealth: Payer: Self-pay

## 2022-04-16 NOTE — Telephone Encounter (Signed)
Notified patient that assistance was expiring 05-18-2022 for dexilant and wanted her portion to be faxed to [phone] her her to do.   Told patient I will

## 2022-04-16 NOTE — Telephone Encounter (Signed)
Patient said she filled it out and faxed it to them as well

## 2022-05-25 ENCOUNTER — Encounter: Payer: Self-pay | Admitting: Gastroenterology

## 2022-05-25 ENCOUNTER — Ambulatory Visit: Payer: Commercial Managed Care - PPO | Admitting: Gastroenterology

## 2022-05-25 VITALS — BP 148/98 | HR 90 | Ht 68.0 in | Wt 274.6 lb

## 2022-05-25 DIAGNOSIS — R197 Diarrhea, unspecified: Secondary | ICD-10-CM

## 2022-05-25 DIAGNOSIS — K219 Gastro-esophageal reflux disease without esophagitis: Secondary | ICD-10-CM

## 2022-05-25 DIAGNOSIS — Z86010 Personal history of colonic polyps: Secondary | ICD-10-CM

## 2022-05-25 DIAGNOSIS — K760 Fatty (change of) liver, not elsewhere classified: Secondary | ICD-10-CM | POA: Diagnosis not present

## 2022-05-25 DIAGNOSIS — R1310 Dysphagia, unspecified: Secondary | ICD-10-CM

## 2022-05-25 MED ORDER — DEXLANSOPRAZOLE 60 MG PO CPDR: 60.0000 mg | DELAYED_RELEASE_CAPSULE | Freq: Every day | ORAL | 11 refills | Status: DC

## 2022-05-25 NOTE — Patient Instructions (Signed)
Continue to work on weight loss.   We have sent the following medications to your pharmacy for you to pick up at your convenience: Dexilant 60 mg daily.   You have been scheduled for an endoscopy. Please follow written instructions given to you at your visit today. If you use inhalers (even only as needed), please bring them with you on the day of your procedure.  _______________________________________________________  If you are age 46 or older, your body mass index should be between 23-30. Your Body mass index is 41.75 kg/m. If this is out of the aforementioned range listed, please consider follow up with your Primary Care Provider.  If you are age 61 or younger, your body mass index should be between 19-25. Your Body mass index is 41.75 kg/m. If this is out of the aformentioned range listed, please consider follow up with your Primary Care Provider.   ________________________________________________________  The [facility] providers would like to encourage you to use [facility] to communicate with providers for non-urgent requests or questions.  Due to long hold times on the telephone, sending your provider a message by [facility] may be a faster and more efficient way to get a response.  Please allow 48 business hours for a response.  Please remember that this is for non-urgent requests.  _______________________________________________________

## 2022-05-25 NOTE — Progress Notes (Signed)
Chief Complaint: FU  Referring Provider:  Laverle Hobby, NP      ASSESSMENT AND PLAN;   #1. GERD with dysphagia  #2. Diarrhea - likely IBS-D with bloating, meds like Mg contributing.  Doubt exocrine pancreatic insufficiency. Neg random Bx for microscopic colitis August 29, 2018, neg SB Bx for celiac 03/2013. Viberzi helped but INS stopped paying.  #3. H/O TA (colon 2016-08-29). FH of polyps (dad), requiring hemicolectomy at age 46.  FH of colon cancer GM. Neg colon 29-Aug-2021  #4. H/O Acute pancreatitis d/t hypertriglyceridemia (peak level 01/2017 3511). No DM. GB intact  #5.  Fatty liver with normal LFTs.  Plan:  - Dexilant 60 mg po QD #30, 11 refills - EGD with dil (jan 2024) - Blood test results from Dr Tenna Delaine office. - Wt loss please.     HPI:    Robin Reed is a 46 y.o. female  For follow-up visit Works in [facility] [employer]  C/O dysphagia -mainly with bread and rice x 6 months, mid chest, intermittent with associated heartburn despite Dexilant every other day.  She had an episode with shrimp when she could not swallow even her own saliva.  Eventually it passed down and she did not require any endoscopic treatment.  Denies having any odynophagia.  No weight loss.  In fact she has gained weight.  No melena or hematochezia.  Still has intermittent diarrhea especially after eating.  At times it does "last the whole day".  She continues to be on magnesium supplements as it has helped her with muscle cramps.  She does occasionally get constipated as well.  She recently had blood work and told me that she had normal CBC and CMP.  Her triglycerides were 133 on fenofibrate.  No further pancreatitis. Has gained wt. TG 200 3 months ago.  No abdominal pain.  Does complain of abdominal bloating.  More so when she eats cheese.  Wt Readings from Last 3 Encounters:  05/25/22 274 lb 9.6 oz (124.6 kg)  07/31/21 237 lb (107.5 kg)  05/13/21 237 lb 6 oz (107.7 kg)      Previous labs from  04/21/2020 showing hemoglobin 13.8, MCV 90, platelets 267.  Her liver tests are all within normal limits with AST 19, a LT 17, alk phos 43.  Her most recent triglycerides were 217 with normal hemoglobin A1c 5.4, TSH 2.87, B12 362. Baseline labs from 2019-hemoglobin 13.6, WBC count 7.7, platelets 287, BUN 15/creatinine 0.9, normal LFTs AST 22, ALT 18.  Triglycerides 399, TSH 4.07. 08/29/17 hemoglobin 13.5, normal liver function tests, triglycerides 609  SH- dad passed away 03/11/2022d/t metastatic squamous cell CA of head and neck.  He did smoke a lot.  Wt Readings from Last 3 Encounters:  05/25/22 274 lb 9.6 oz (124.6 kg)  07/31/21 237 lb (107.5 kg)  05/13/21 237 lb 6 oz (107.7 kg)    Past Medical History:  Diagnosis Date   Anemia    Anxiety    Elevated cholesterol with high triglycerides    GERD (gastroesophageal reflux disease)    Hyperlipidemia    IBS (irritable bowel syndrome)    Pancreatitis    Tachycardia    UTI (urinary tract infection)     Past Surgical History:  Procedure Laterality Date   ANAL SPHINCTEROPLASTY     COLONOSCOPY  05/21/2013   Very minimal sigmoid diverticulosis. Small internal hemorrhoids. Otherwise normal colon to TI.    episiotomy repair  08/30/2003   ESOPHAGOGASTRODUODENOSCOPY  04/02/2013   Mild  gastrtiis. Otherwise normal EGD   FOOT SURGERY Right    HERNIA REPAIR  16/03/9603   umbilical and right inginual    Leep surgery  11/26/2013   Nasal Surgery     Right Inguinal Hernia Repair     Skin Graft mouth     TONSILLECTOMY      Family History  Problem Relation Age of Onset   Clotting disorder Mother    Colonic polyp Father    Lung cancer Father    Throat cancer Father    Cancer Father        Neck    Colon cancer Paternal Grandmother    Esophageal cancer Neg Hx    Breast cancer Neg Hx     Social History   Tobacco Use   Smoking status: Never   Smokeless tobacco: Never  Vaping Use   Vaping Use: Never used  Substance Use Topics    Alcohol use: Not Currently   Drug use: Never    Current Outpatient Medications  Medication Sig Dispense Refill   aspirin 81 MG tablet Take 81 mg by mouth at bedtime.     Coenzyme Q10 (COQ10) 100 MG CAPS Take 1 capsule by mouth daily.     cyanocobalamin (,VITAMIN B-12,) 1000 MCG/ML injection Inject 1,000 mcg into the muscle every 30 (thirty) days.     dexlansoprazole (DEXILANT) 60 MG capsule Take 60 mg by mouth daily.     dicyclomine (BENTYL) 10 MG capsule Take 1 capsule (10 mg total) by mouth 4 (four) times daily as needed for spasms. 120 capsule 6   drospirenone-ethinyl estradiol (YAZ) 3-0.02 MG tablet Take 1 tablet by mouth daily.     fenofibrate (TRICOR) 145 MG tablet TAKE ONE TABLET BY MOUTH ONCE DAILY 30     Loratadine 10 MG CAPS Take 1 capsule by mouth daily.     Magnesium 500 MG TABS Take 500 mg by mouth at bedtime.     meloxicam (MOBIC) 15 MG tablet Take 15 mg by mouth daily.     metoprolol tartrate (LOPRESSOR) 25 MG tablet Take 25 mg by mouth 2 (two) times daily.     Multiple Vitamin (MULTIVITAMIN) tablet Take 1 tablet by mouth daily. With iron/ Unknown strenght     MYRBETRIQ 25 MG TB24 tablet Take 25 mg by mouth daily.     Omega-3 Fatty Acids (FISH OIL) 1000 MG CAPS Take 2 capsules by mouth 2 (two) times daily.     Potassium 99 MG TABS Take 1 tablet by mouth daily.     Red Yeast Rice 600 MG TABS Take 1 tablet by mouth 2 (two) times daily.     rOPINIRole (REQUIP) 2 MG tablet Take 2 mg by mouth at bedtime.     rosuvastatin (CRESTOR) 40 MG tablet Take 40 mg by mouth at bedtime.     Ubrogepant (UBRELVY) 100 MG TABS 1 tablet may take second dose at least 2 hours after first dose as needed     VESICARE 10 MG tablet Take 10 mg by mouth daily.     VITAMIN D, CHOLECALCIFEROL, PO Take 2,000 Units by mouth daily.     No current facility-administered medications for this visit.    No Known Allergies  Review of Systems:       Physical Exam:    BP (!) 148/98   Pulse 90   Ht 5'  8" (1.727 m)   Wt 274 lb 9.6 oz (124.6 kg)   BMI 41.75 kg/m  [employer]  05/25/22 1546  Weight: 274 lb 9.6 oz (124.6 kg)  Gen: awake, alert, NAD HEENT: anicteric, no pallor CV: RRR, no mrg Pulm: CTA b/l Abd: soft, NT/ND, +BS throughout Ext: no c/c/e Neuro: nonfocal     Carmell Austria, MD 05/25/2022, 3:48 PM  Cc: Dr Jannette Fogo

## 2022-06-03 ENCOUNTER — Ambulatory Visit: Payer: Commercial Managed Care - PPO | Admitting: Allergy and Immunology

## 2022-06-03 ENCOUNTER — Encounter: Payer: Self-pay | Admitting: Allergy and Immunology

## 2022-06-03 VITALS — BP 148/108 | HR 88 | Resp 20 | Ht 66.7 in | Wt 278.2 lb

## 2022-06-03 DIAGNOSIS — J3089 Other allergic rhinitis: Secondary | ICD-10-CM | POA: Diagnosis not present

## 2022-06-03 DIAGNOSIS — K224 Dyskinesia of esophagus: Secondary | ICD-10-CM

## 2022-06-03 DIAGNOSIS — K219 Gastro-esophageal reflux disease without esophagitis: Secondary | ICD-10-CM | POA: Diagnosis not present

## 2022-06-03 DIAGNOSIS — T781XXA Other adverse food reactions, not elsewhere classified, initial encounter: Secondary | ICD-10-CM

## 2022-06-03 MED ORDER — FAMOTIDINE 40 MG PO TABS: 40.0000 mg | ORAL_TABLET | Freq: Every day | ORAL | 5 refills | Status: DC

## 2022-06-03 NOTE — Patient Instructions (Addendum)
  1. Avoid seafood consumption  2. Skin testing next week  3. Epi-pen, benadryl, MD/ER evaluation for allergic reaction  4. Claritin 10 mg - 1 tablet 1 time per day  5. Dexilant 60 mg -1 time per day + ADD famotidine 40 mg in PM + minimize caffeine / chocolate consumption  6. Obtain upper endoscopy

## 2022-06-03 NOTE — Progress Notes (Signed)
[facility] [place]   Dear Laverle Hobby,  Thank you for referring Robin Reed to the [facility] on 06/03/2022.   Below is a summation of this patient's evaluation and recommendations.  Thank you for your referral. I will keep you informed about this patient's response to treatment.   If you have any questions please do not hesitate to contact me.   Sincerely,  Jiles Prows, MD Allergy / Immunology [place]   ______________________________________________________________________    NEW PATIENT NOTE  Referring Provider: Laverle Hobby, NP Primary Provider: Laverle Hobby, NP Date of office visit: 06/03/2022    Subjective:   Chief Complaint:  Robin Reed (DOB: 1976/04/24) is a 46 y.o. female who presents to the clinic on 06/03/2022 with a chief complaint of Allergic Reaction .     HPI: Robin Reed presents to this clinic in evaluation of possible food allergy.  In mid October 2023 she ate a fried fish and shrimp plate and within minutes of finishing that plate she developed coughing and chest tightness and air hunger for which she took a Claritin and a Benadryl and had evaluation by EMS.  She had no other associated systemic or constitutional symptoms and did not have any additional therapy.  Since that point in time she has not eaten any seafood.  She does have a history of allergic rhinitis with nasal congestion and sniffing and snorting for which she takes Claritin which works pretty well.  There seems to be some seasonality involved with this issue.  She has been having swallowing problems.  For the past 6 months she feels as though there is something stuck in her chest when she eats and stuck in her throat when she eats.  She will have to drink a whole bunch of water to clear out that food.  She has been having lots of regurgitation.   She is currently using Dexilant for her reflux.  She drinks 12 ounces of coffee per day and has no other sources of caffeine or alcohol.  Her last endoscopy was 2014.  Past Medical History:  Diagnosis Date   Anemia    Anxiety    Elevated cholesterol with high triglycerides    GERD (gastroesophageal reflux disease)    High triglycerides    Hyperlipidemia    IBS (irritable bowel syndrome)    Migraine    Pancreatitis    Restless legs    Tachycardia    UTI (urinary tract infection)     Past Surgical History:  Procedure Laterality Date   ANAL SPHINCTEROPLASTY     COLONOSCOPY  05/21/2013   Very minimal sigmoid diverticulosis. Small internal hemorrhoids. Otherwise normal colon to TI.    episiotomy repair  07/2003   ESOPHAGOGASTRODUODENOSCOPY  04/02/2013   Mild gastrtiis. Otherwise normal EGD   FOOT SURGERY Right    HERNIA REPAIR  70/26/3785   umbilical and right inginual    Leep surgery  11/26/2013   Nasal Surgery     PERINEOPLASTY  01/07/2004   Right Inguinal Hernia Repair  06/30/2016   Skin Graft mouth     TONSILLECTOMY  88/50/2774   UMBILICAL HERNIA REPAIR  06/30/2016    Allergies as of 06/03/2022   No Known Allergies      Medication List    aspirin 81 MG tablet Take 81 mg by mouth at bedtime.   CoQ10 100 MG Caps Take  1 capsule by mouth daily.   cyanocobalamin 1000 MCG/ML injection Commonly known as: VITAMIN B12 Inject 1,000 mcg into the muscle every 30 (thirty) days.   dexlansoprazole 60 MG capsule Commonly known as: DEXILANT Take 1 capsule (60 mg total) by mouth daily.   dicyclomine 10 MG capsule Commonly known as: BENTYL Take 1 capsule (10 mg total) by mouth 4 (four) times daily as needed for spasms.   drospirenone-ethinyl estradiol 3-0.02 MG tablet Commonly known as: YAZ Take 1 tablet by mouth daily.   EpiPen 2-Pak 0.3 mg/0.3 mL Soaj injection Generic drug: EPINEPHrine as directed Injection once a day as needed for allergic reaction for 30 days    fenofibrate 160 MG tablet TAKE ONE TABLET BY MOUTH EVERY DAY Orally Once a day for 30 days   Fish Oil 1000 MG Caps Take 2 capsules by mouth 2 (two) times daily.   Loratadine 10 MG Caps Take 1 capsule by mouth daily.   Magnesium 500 MG Tabs Take 500 mg by mouth at bedtime.   Magnesium Glycinate 100 MG Caps at bedtime.   meloxicam 15 MG tablet Commonly known as: MOBIC Take 15 mg by mouth daily.   metoprolol tartrate 25 MG tablet Commonly known as: LOPRESSOR Take 25 mg by mouth 2 (two) times daily.   multivitamin tablet Take 1 tablet by mouth daily. With iron/ Unknown strenght   Potassium 99 MG Tabs Take 1 tablet by mouth daily.   Red Yeast Rice 600 MG Tabs Take 1 tablet by mouth 2 (two) times daily.   rOPINIRole 2 MG tablet Commonly known as: REQUIP Take 2 mg by mouth at bedtime.   rosuvastatin 40 MG tablet Commonly known as: CRESTOR Take 40 mg by mouth at bedtime.   Ubrelvy 100 MG Tabs Generic drug: Ubrogepant 1 tablet may take second dose at least 2 hours after first dose as needed   Vitamin D-3 125 MCG (5000 UT) Tabs Take by mouth daily.    Review of systems negative except as noted in HPI / PMHx or noted below:  Review of Systems  Constitutional: Negative.   HENT: Negative.    Eyes: Negative.   Respiratory: Negative.    Cardiovascular: Negative.   Gastrointestinal: Negative.   Genitourinary: Negative.   Musculoskeletal: Negative.   Skin: Negative.   Neurological: Negative.   Endo/Heme/Allergies: Negative.   Psychiatric/Behavioral: Negative.      Family History  Problem Relation Age of Onset   High blood pressure Mother    Clotting disorder Mother    High blood pressure Father    Colonic polyp Father    Lung cancer Father    Throat cancer Father    Cancer Father        Neck    COPD Father    Congestive Heart Failure Father    High blood pressure Brother    Colon cancer Paternal Grandmother    Esophageal cancer Neg Hx    Breast cancer  Neg Hx     Social History   Socioeconomic History   Marital status: Divorced    Spouse name: Not on file   Number of children: 1   Years of education: Not on file   Highest education level: Not on file  Occupational History   Occupation: CNA  Tobacco Use   Smoking status: Never   Smokeless tobacco: Never  Vaping Use   Vaping Use: Never used  Substance and Sexual Activity   Alcohol use: Not Currently   Drug use: Never  Sexual activity: Not on file  Other Topics Concern   Not on file  Social History Narrative   Not on file   Environmental and Social history  Lives in a house with a dry environment, a dog located inside the household, carpet in the bedroom, plastic on the bed, plastic on the pillow, and no smoking ongoing with inside the household.  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  Objective:   Vitals:   06/03/22 1415 06/03/22 1437  BP: (!) 152/102 (!) 148/108  Pulse: 88   Resp: 20   SpO2: 98%    Height: 5' 6.7" (169.4 cm) Weight: 278 lb 3.2 oz (126.2 kg)  Physical Exam Constitutional:      Appearance: She is not diaphoretic.  HENT:     Head: Normocephalic.     Right Ear: Tympanic membrane, ear canal and external ear normal.     Left Ear: Tympanic membrane, ear canal and external ear normal.     Nose: Nose normal. No mucosal edema or rhinorrhea.     Mouth/Throat:     Pharynx: Uvula midline. No oropharyngeal exudate.  Eyes:     Conjunctiva/sclera: Conjunctivae normal.  Neck:     Thyroid: No thyromegaly.     Trachea: Trachea normal. No tracheal tenderness or tracheal deviation.  Cardiovascular:     Rate and Rhythm: Normal rate and regular rhythm.     Heart sounds: Normal heart sounds, S1 normal and S2 normal. No murmur heard. Pulmonary:     Effort: No respiratory distress.     Breath sounds: Normal breath sounds. No stridor. No wheezing or rales.  Lymphadenopathy:     Head:     Right side of head: No tonsillar adenopathy.     Left side  of head: No tonsillar adenopathy.     Cervical: No cervical adenopathy.  Skin:    Findings: No erythema or rash.     Nails: There is no clubbing.  Neurological:     Mental Status: She is alert.     Diagnostics: Allergy skin tests were not performed.   Assessment and Plan:    1. Adverse food reaction, initial encounter   2. Gastroesophageal reflux disease, unspecified whether esophagitis present   3. Esophageal dysmotility   4. Perennial allergic rhinitis    1. Avoid seafood consumption  2. Skin testing next week  3. Epi-pen, benadryl, MD/ER evaluation for allergic reaction  4. Claritin 10 mg - 1 tablet 1 time per day  5. Dexilant 60 mg -1 time per day + ADD famotidine 40 mg in PM + minimize caffeine / chocolate consumption  6. Obtain upper endoscopy  Robin Reed has had a allergic reaction and we will work through whether or not this was secondary to exposure to a specific food when she returns to this clinic next week for skin testing.  She also appears to have very bad reflux with esophageal dysmotility.  Will increase her therapy for reflux and I recommended to her that she obtain an upper endoscopy sometime in the near future to address this issue in more detail.  Her gastroenterologist can make a decision about obtaining barium swallow.  Jiles Prows, MD Allergy / Immunology [facility]

## 2022-06-07 ENCOUNTER — Encounter: Payer: Self-pay | Admitting: Allergy and Immunology

## 2022-06-07 ENCOUNTER — Ambulatory Visit (INDEPENDENT_AMBULATORY_CARE_PROVIDER_SITE_OTHER): Payer: Commercial Managed Care - PPO | Admitting: Allergy and Immunology

## 2022-06-07 DIAGNOSIS — T781XXA Other adverse food reactions, not elsewhere classified, initial encounter: Secondary | ICD-10-CM

## 2022-06-07 DIAGNOSIS — T781XXD Other adverse food reactions, not elsewhere classified, subsequent encounter: Secondary | ICD-10-CM

## 2022-06-08 NOTE — Progress Notes (Signed)
Meli returns to this clinic to have skin testing performed.  Skin testing directed against foods did not identify any hypersensitivity.  She will obtain a IgE panel directed against shellfish and fish.

## 2022-06-30 ENCOUNTER — Encounter: Payer: Self-pay | Admitting: *Deleted

## 2022-07-05 ENCOUNTER — Telehealth: Payer: Self-pay | Admitting: Gastroenterology

## 2022-07-05 NOTE — Telephone Encounter (Signed)
Pt questioned if she should proceed with her procedure after having a root canal: Pt was notified to contact her dentist and seek there recommendations: Pt verbalized understanding with all questions answered.

## 2022-07-05 NOTE — Telephone Encounter (Signed)
Patient is calling states she will be having a root canal before her procedure and is wondering if there would be any problems. Please advise

## 2022-07-19 ENCOUNTER — Encounter: Payer: Self-pay | Admitting: Gastroenterology

## 2022-07-19 ENCOUNTER — Ambulatory Visit (AMBULATORY_SURGERY_CENTER): Payer: Commercial Managed Care - PPO | Admitting: Gastroenterology

## 2022-07-19 VITALS — BP 144/82 | HR 82 | Temp 99.3°F | Resp 18 | Ht 68.0 in | Wt 274.0 lb

## 2022-07-19 DIAGNOSIS — K2100 Gastro-esophageal reflux disease with esophagitis, without bleeding: Secondary | ICD-10-CM

## 2022-07-19 DIAGNOSIS — K219 Gastro-esophageal reflux disease without esophagitis: Secondary | ICD-10-CM | POA: Diagnosis not present

## 2022-07-19 DIAGNOSIS — I83893 Varicose veins of bilateral lower extremities with other complications: Secondary | ICD-10-CM | POA: Insufficient documentation

## 2022-07-19 DIAGNOSIS — R1310 Dysphagia, unspecified: Secondary | ICD-10-CM | POA: Diagnosis not present

## 2022-07-19 HISTORY — DX: Varicose veins of bilateral lower extremities with other complications: I83.893

## 2022-07-19 MED ORDER — SODIUM CHLORIDE 0.9 % IV SOLN: 500.0000 mL | Freq: Once | INTRAVENOUS | Status: DC

## 2022-07-19 NOTE — Progress Notes (Signed)
Report to PACU, RN, vss, BBS= Clear.  

## 2022-07-19 NOTE — Progress Notes (Signed)
Called to room to assist during endoscopic procedure.  Patient ID and intended procedure confirmed with present staff. Received instructions for my participation in the procedure from the performing physician.  

## 2022-07-19 NOTE — Op Note (Signed)
[facility] Patient Name: Robin Reed Procedure Date: 07/19/2022 9:43 AM MRN: [id number] Endoscopist: Jackquline Denmark , MD, [id number] Age: 47 Referring MD:  Date of Birth: Feb 06, 1976 Gender: Female Account #: [id number] Procedure:                Upper GI endoscopy Indications:              Dysphagia Medicines:                Monitored Anesthesia Care Procedure:                Pre-Anesthesia Assessment:                           - Prior to the procedure, a History and Physical                            was performed, and patient medications and                            allergies were reviewed. The patient's tolerance of                            previous anesthesia was also reviewed. The risks                            and benefits of the procedure and the sedation                            options and risks were discussed with the patient.                            All questions were answered, and informed consent                            was obtained. Prior Anticoagulants: The patient has                            taken no anticoagulant or antiplatelet agents. ASA                            Grade Assessment: II - A patient with mild systemic                            disease. After reviewing the risks and benefits,                            the patient was deemed in satisfactory condition to                            undergo the procedure.                           After obtaining informed consent, the endoscope was  passed under direct vision. Throughout the                            procedure, the patient's blood pressure, pulse, and                            oxygen saturations were monitored continuously. The                            Endoscope was introduced through the mouth, and                            advanced to the second part of duodenum. The upper                            GI endoscopy was accomplished without  difficulty.                            The patient tolerated the procedure well. Scope In: Scope Out: Findings:                 The examined esophagus was normal with well-defined                            Z-line at 40 cm, examined by NBI. Biopsies were                            obtained from the proximal and distal esophagus                            with cold forceps for histology to r/o eosinophilic                            esophagitis. The scope was withdrawn. Dilation was                            performed with a Maloney dilator with mild                            resistance at 66 Fr and 54 Fr.                           The entire examined stomach was normal. Biopsies                            were taken with a cold forceps for histology.                           The examined duodenum was normal. Biopsies for                            histology were taken with a cold forceps for  evaluation of celiac disease. Complications:            No immediate complications. Estimated Blood Loss:     Estimated blood loss: none. Impression:               - Normal EGD.                           - S/P esophageal dilatation. Recommendation:           - Patient has a contact number available for                            emergencies. The signs and symptoms of potential                            delayed complications were discussed with the                            patient. Return to normal activities tomorrow.                            Written discharge instructions were provided to the                            patient.                           - Post dilatation diet.                           - Continue present medications including Dexilant                            60 mg p.o. daily.                           - Await pathology results.                           - The findings and recommendations were discussed                            with the  patient's family. Jackquline Denmark, MD 07/19/2022 10:01:17 AM This report has been signed electronically.

## 2022-07-19 NOTE — Patient Instructions (Signed)
Diet instructions: see Post dilation diet handout.  Continue present medications including dexilant.   YOU HAD AN ENDOSCOPIC PROCEDURE TODAY AT [facility]:   Refer to the procedure report that was given to you for any specific questions about what was found during the examination.  If the procedure report does not answer your questions, please call your gastroenterologist to clarify.  If you requested that your care partner not be given the details of your procedure findings, then the procedure report has been included in a sealed envelope for you to review at your convenience later.  YOU SHOULD EXPECT: Some feelings of bloating in the abdomen. Passage of more gas than usual.  Walking can help get rid of the air that was put into your GI tract during the procedure and reduce the bloating. If you had a lower endoscopy (such as a colonoscopy or flexible sigmoidoscopy) you may notice spotting of blood in your stool or on the toilet paper. If you underwent a bowel prep for your procedure, you may not have a normal bowel movement for a few days.  Please Note:  You might notice some irritation and congestion in your nose or some drainage.  This is from the oxygen used during your procedure.  There is no need for concern and it should clear up in a day or so.  SYMPTOMS TO REPORT IMMEDIATELY:  Following upper endoscopy (EGD)  Vomiting of blood or coffee ground material  New chest pain or pain under the shoulder blades  Painful or persistently difficult swallowing  New shortness of breath  Fever of 100F or higher  Black, tarry-looking stools  For urgent or emergent issues, a gastroenterologist can be reached at any hour by calling [phone]. Do not use MyChart messaging for urgent concerns.    DIET:  see post dilation diet, nothing by mouth until 11 am, then clear liquids until noon, soft diet until tomorrow, then you may proceed to your regular diet.  Drink plenty of fluids  but you should avoid alcoholic beverages for 24 hours.  ACTIVITY:  You should plan to take it easy for the rest of today and you should NOT DRIVE or use heavy machinery until tomorrow (because of the sedation medicines used during the test).    FOLLOW UP: Our staff will call the number listed on your records the next business day following your procedure.  We will call around 7:15- 8:00 am to check on you and address any questions or concerns that you may have regarding the information given to you following your procedure. If we do not reach you, we will leave a message.     If any biopsies were taken you will be contacted by phone or by letter within the next 1-3 weeks.  Please call us at [phone] if you have not heard about the biopsies in 3 weeks.    SIGNATURES/CONFIDENTIALITY: You and/or your care partner have signed paperwork which will be entered into your electronic medical record.  These signatures attest to the fact that that the information above on your After Visit Summary has been reviewed and is understood.  Full responsibility of the confidentiality of this discharge information lies with you and/or your care-partner.

## 2022-07-19 NOTE — Progress Notes (Signed)
Pt's states no medical or surgical changes since previsit or office visit. 

## 2022-07-19 NOTE — Progress Notes (Signed)
Chief Complaint: FU  Referring Provider:  Laverle Hobby, NP      ASSESSMENT AND PLAN;   #1. GERD with dysphagia  #2. Diarrhea - likely IBS-D with bloating, meds like Mg contributing.  Doubt exocrine pancreatic insufficiency. Neg random Bx for microscopic colitis 26-Aug-2018, neg SB Bx for celiac 03/2013. Viberzi helped but INS stopped paying.  #3. H/O TA (colon 08/26/16). FH of polyps (dad), requiring hemicolectomy at age 47.  FH of colon cancer GM. Neg colon 08-26-21  #4. H/O Acute pancreatitis d/t hypertriglyceridemia (peak level 01/2017 3511). No DM. GB intact  #5.  Fatty liver with normal LFTs.  Plan:  - Dexilant 60 mg po QD #30, 11 refills - EGD with dil (jan 2024) - Blood test results from Dr Tenna Delaine office. - Wt loss please.     HPI:    Robin Reed is a 47 y.o. female  For follow-up visit Works in [employer]  C/O dysphagia -mainly with bread and rice x 6 months, mid chest, intermittent with associated heartburn despite Dexilant every other day.  She had an episode with shrimp when she could not swallow even her own saliva.  Eventually it passed down and she did not require any endoscopic treatment.  Denies having any odynophagia.  No weight loss.  In fact she has gained weight.  No melena or hematochezia.  Still has intermittent diarrhea especially after eating.  At times it does "last the whole day".  She continues to be on magnesium supplements as it has helped her with muscle cramps.  She does occasionally get constipated as well.  She recently had blood work and told me that she had normal CBC and CMP.  Her triglycerides were 133 on fenofibrate.  No further pancreatitis. Has gained wt. TG 200 3 months ago.  No abdominal pain.  Does complain of abdominal bloating.  More so when she eats cheese.  Wt Readings from Last 3 Encounters:  07/19/22 274 lb (124.3 kg)  06/03/22 278 lb 3.2 oz (126.2 kg)  05/25/22 274 lb 9.6 oz (124.6 kg)      Previous labs  from 04/21/2020 showing hemoglobin 13.8, MCV 90, platelets 267.  Her liver tests are all within normal limits with AST 19, a LT 17, alk phos 43.  Her most recent triglycerides were 217 with normal hemoglobin A1c 5.4, TSH 2.87, B12 362. Baseline labs from 2019-hemoglobin 13.6, WBC count 7.7, platelets 287, BUN 15/creatinine 0.9, normal LFTs AST 22, ALT 18.  Triglycerides 399, TSH 4.07. 2017/08/26 hemoglobin 13.5, normal liver function tests, triglycerides 609  SH- dad passed away 03-08-2022d/t metastatic squamous cell CA of head and neck.  He did smoke a lot.  Wt Readings from Last 3 Encounters:  07/19/22 274 lb (124.3 kg)  06/03/22 278 lb 3.2 oz (126.2 kg)  05/25/22 274 lb 9.6 oz (124.6 kg)    Past Medical History:  Diagnosis Date   Anemia    Anxiety    Elevated cholesterol with high triglycerides    GERD (gastroesophageal reflux disease)    High triglycerides    Hyperlipidemia    IBS (irritable bowel syndrome)    Migraine    Pancreatitis    Restless legs    Tachycardia    UTI (urinary tract infection)     Past Surgical History:  Procedure Laterality Date   ANAL SPHINCTEROPLASTY     COLONOSCOPY  05/21/2013   Very minimal sigmoid diverticulosis. Small internal hemorrhoids. Otherwise normal colon to TI.  episiotomy repair  07/2003   ESOPHAGOGASTRODUODENOSCOPY  04/02/2013   Mild gastrtiis. Otherwise normal EGD   FOOT SURGERY Right    HERNIA REPAIR  19/62/2297   umbilical and right inginual    Leep surgery  11/26/2013   Nasal Surgery     PERINEOPLASTY  01/07/2004   Right Inguinal Hernia Repair  06/30/2016   Skin Graft mouth     TONSILLECTOMY  98/92/1194   UMBILICAL HERNIA REPAIR  06/30/2016    Family History  Problem Relation Age of Onset   High blood pressure Mother    Clotting disorder Mother    High blood pressure Father    Colonic polyp Father    Lung cancer Father    Throat cancer Father    Cancer Father        Neck    COPD Father    Congestive Heart  Failure Father    High blood pressure Brother    Colon cancer Paternal Grandmother    Esophageal cancer Neg Hx    Breast cancer Neg Hx     Social History   Tobacco Use   Smoking status: Never   Smokeless tobacco: Never  Vaping Use   Vaping Use: Never used  Substance Use Topics   Alcohol use: Not Currently   Drug use: Never    Current Outpatient Medications  Medication Sig Dispense Refill   aspirin 81 MG tablet Take 81 mg by mouth at bedtime.     Cholecalciferol (VITAMIN D-3) 125 MCG (5000 UT) TABS Take by mouth daily.     Coenzyme Q10 (COQ10) 100 MG CAPS Take 1 capsule by mouth daily.     dexlansoprazole (DEXILANT) 60 MG capsule Take 1 capsule (60 mg total) by mouth daily. 30 capsule 11   dicyclomine (BENTYL) 10 MG capsule Take 1 capsule (10 mg total) by mouth 4 (four) times daily as needed for spasms. 120 capsule 6   drospirenone-ethinyl estradiol (YAZ) 3-0.02 MG tablet Take 1 tablet by mouth daily.     famotidine (PEPCID) 40 MG tablet Take 1 tablet (40 mg total) by mouth at bedtime. 30 tablet 5   fenofibrate 160 MG tablet TAKE ONE TABLET BY MOUTH EVERY DAY Orally Once a day for 30 days     Loratadine 10 MG CAPS Take 1 capsule by mouth daily.     MACROBID 100 MG capsule      Magnesium 500 MG TABS Take 500 mg by mouth at bedtime.     meloxicam (MOBIC) 15 MG tablet Take 15 mg by mouth daily.     metoprolol tartrate (LOPRESSOR) 25 MG tablet Take 25 mg by mouth 2 (two) times daily.     Multiple Vitamin (MULTIVITAMIN) tablet Take 1 tablet by mouth daily. With iron/ Unknown strenght     Omega-3 Fatty Acids (FISH OIL) 1000 MG CAPS Take 2 capsules by mouth 2 (two) times daily.     Potassium 99 MG TABS Take 1 tablet by mouth daily.     Red Yeast Rice 600 MG TABS Take 1 tablet by mouth 2 (two) times daily.     rOPINIRole (REQUIP) 2 MG tablet Take 2 mg by mouth at bedtime.     rosuvastatin (CRESTOR) 40 MG tablet Take 40 mg by mouth at bedtime.     amoxicillin-clavulanate (AUGMENTIN)  875-125 MG tablet Take 1 tablet by mouth 2 (two) times daily. (Patient not taking: Reported on 07/19/2022)     ciprofloxacin (CIPRO) 250 MG tablet Take 250 mg by mouth 2 (  two) times daily. (Patient not taking: Reported on 07/19/2022)     cyanocobalamin (,VITAMIN B-12,) 1000 MCG/ML injection Inject 1,000 mcg into the muscle every 30 (thirty) days.     EPIPEN 2-PAK 0.3 MG/0.3ML SOAJ injection as directed Injection once a day as needed for allergic reaction for 30 days     Magnesium Glycinate 100 MG CAPS at bedtime.     metFORMIN (GLUCOPHAGE-XR) 500 MG 24 hr tablet 1 tablet with evening meal Orally Once a day for 30 days (Patient not taking: Reported on 07/19/2022)     Ubrogepant (UBRELVY) 100 MG TABS 1 tablet may take second dose at least 2 hours after first dose as needed     Current Facility-Administered Medications  Medication Dose Route Frequency Provider Last Rate Last Admin   0.9 %  sodium chloride infusion  500 mL Intravenous Once Jackquline Denmark, MD        No Known Allergies  Review of Systems:       Physical Exam:    BP (!) 151/92   Pulse 99   Temp 99.3 F (37.4 C)   Ht '5\' 8"'$  (1.727 m)   Wt 274 lb (124.3 kg)   SpO2 100%   BMI 41.66 kg/m  Filed Weights   07/19/22 0848  Weight: 274 lb (124.3 kg)  Gen: awake, alert, NAD HEENT: anicteric, no pallor CV: RRR, no mrg Pulm: CTA b/l Abd: soft, NT/ND, +BS throughout Ext: no c/c/e Neuro: nonfocal     Carmell Austria, MD 07/19/2022, 9:41 AM  Cc: Dr Jannette Fogo

## 2022-07-20 ENCOUNTER — Telehealth: Payer: Self-pay

## 2022-07-20 NOTE — Telephone Encounter (Signed)
  Follow up Call-     07/19/2022    8:58 AM 07/31/2021    7:42 AM  Call back number  Post procedure Call Back phone  # [phone]  Permission to leave phone message Yes Yes     Patient questions:  Do you have a fever, pain , or abdominal swelling? No. Pain Score  0 *  Have you tolerated food without any problems? Yes.    Have you been able to return to your normal activities? Yes.    Do you have any questions about your discharge instructions: Diet   No. Medications  No. Follow up visit  No.  Do you have questions or concerns about your Care? No.  Actions: * If pain score is 4 or above: No action needed, pain <4.

## 2022-07-24 ENCOUNTER — Encounter: Payer: Self-pay | Admitting: Gastroenterology

## 2022-09-02 ENCOUNTER — Other Ambulatory Visit: Payer: Self-pay | Admitting: Gastroenterology

## 2022-09-21 IMAGING — CT CT HEART MORP W/ CTA COR W/ SCORE W/ CA W/CM &/OR W/O CM
4 of 7 series · 8 of 20 positions shown, 9 images · IV contrast (APPLIED)
Comparison: None.

Addendum:
CLINICAL DATA: Chest pain

EXAM:
Cardiac CTA
MEDICATIONS:
Sub lingual nitro. 4mg x 2
TECHNIQUE: The patient was scanned on a Siemens [REDACTED]ice scanner. Gantry
rotation speed was 250 msecs. Collimation was 0.6 mm. A 100 kV
prospective scan was triggered in the ascending thoracic aorta at
35-75% of the R-R interval. Average HR during the scan was 60 bpm.
The 3D data set was interpreted on a dedicated work station using
MPR, MIP and VRT modes. A total of 80cc of contrast was used.

[Series 6: best diast 74 % · axial · 0.42mm/px · z∈[+1165,+1210]mm · 2 of 336 slices shown]
[im 112/336  vessel]
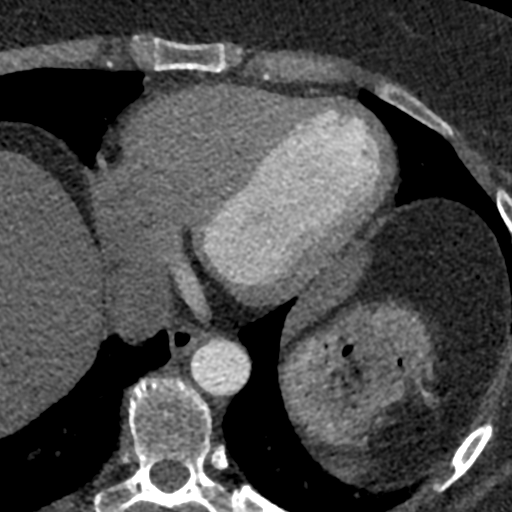
[im 224/336  vessel]
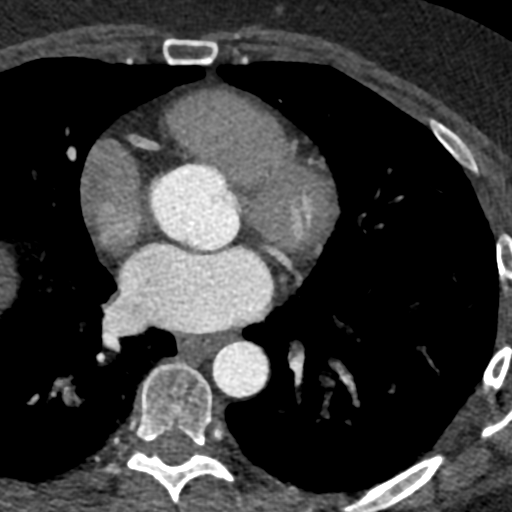

[Series 7: ts diast sharp · axial · 0.42mm/px · z∈[+1165,+1210]mm · 2 of 336 slices shown]
[im 112/336  lung]
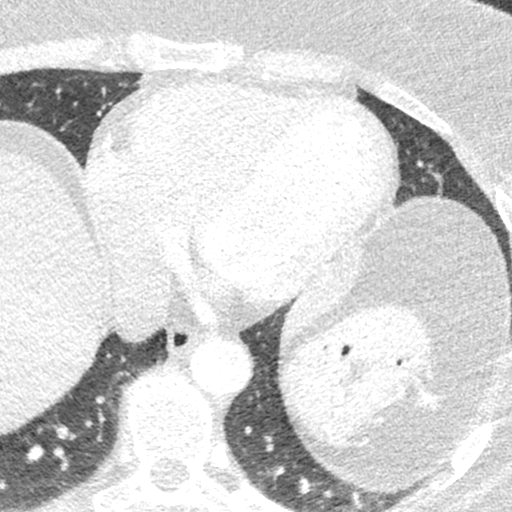
[im 224/336  lung]
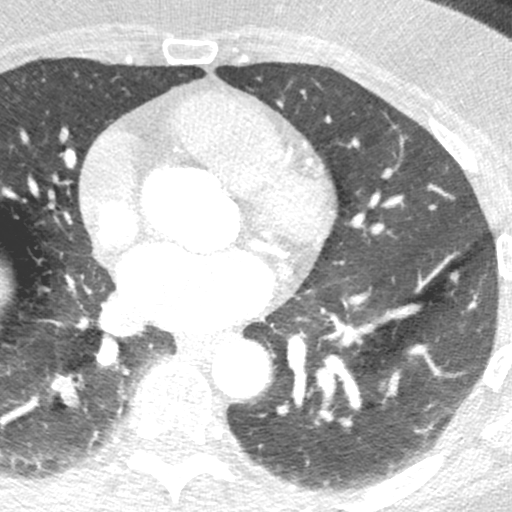

[Series 8: best syst · axial · 0.42mm/px · z∈[+1165,+1210]mm · 2 of 336 slices shown, 3 images]
[im 112/336  vessel]
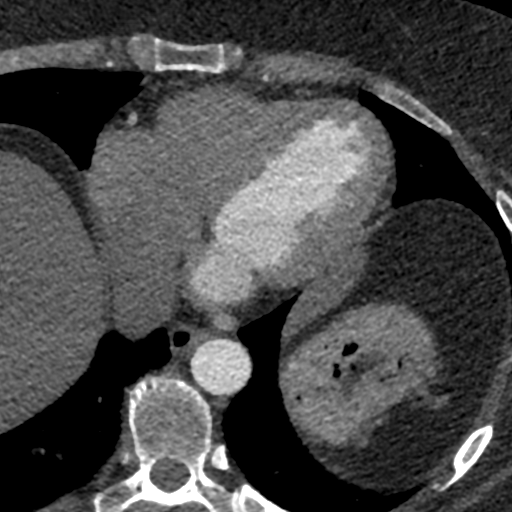
[im 112/336  lung]
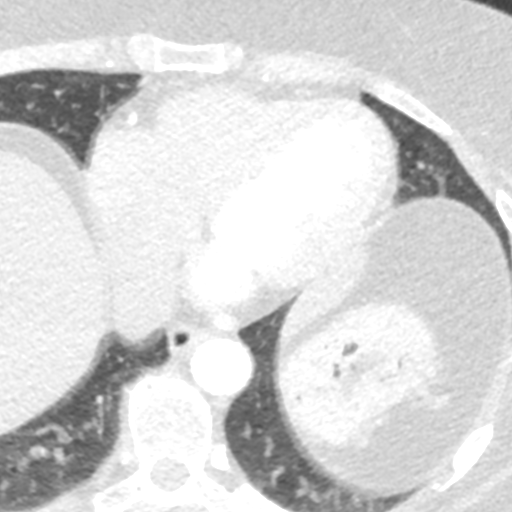
[im 224/336  vessel]
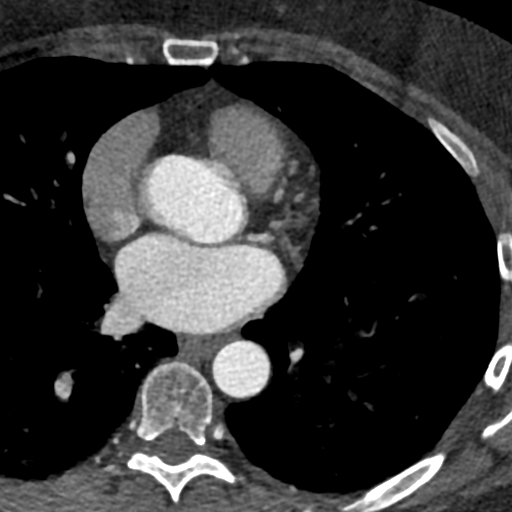

[Series 9: ts syst sharp 40 % · axial · 0.42mm/px · z∈[+1165,+1210]mm · 2 of 336 slices shown]
[im 112/336  lung]
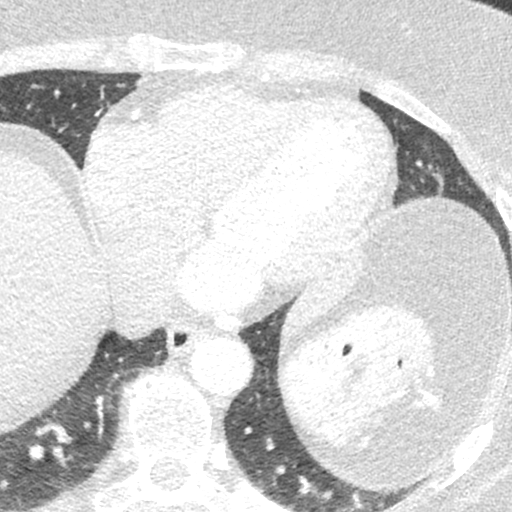
[im 224/336  lung]
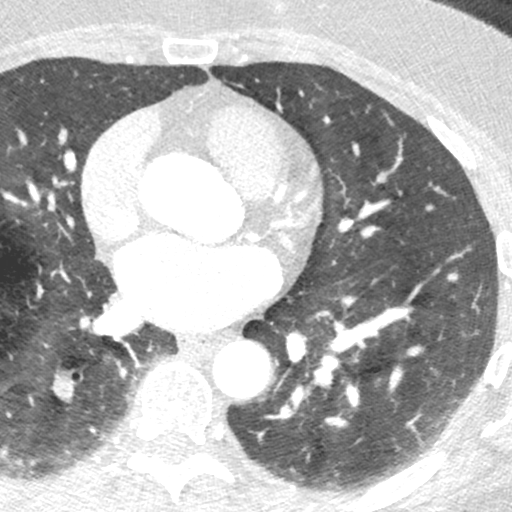

[8 of 20 positions shown; findings below may reference images not displayed]

FINDINGS: Non-cardiac: See separate report from [REDACTED].

Pulmonary veins drain normally to the left atrium. No LA appendage
thrombus noted.

Calcium Score: 0 Agatston units.

Coronary Arteries: Right dominant with no anomalies

LM: No plaque or stenosis.

LAD system:  No plaque or stenosis.

Circumflex system: No plaque or stenosis.

RCA system:  No plaque or stenosis.
IMPRESSION: 1. Coronary artery calcium score 0 Agatston units, suggesting low
risk for future cardiac events.

2. Images degraded by misregistration artifact, but suspect no
plaque or stenosis in the coronary tree.

Jimin Yapur

EXAM:
OVER-READ INTERPRETATION  CT CHEST

The following report is an over-read performed by radiologist Dr.
over-read does not include interpretation of cardiac or coronary
anatomy or pathology. The coronary CTA interpretation by the
cardiologist is attached.
FINDINGS: No mediastinal mass or adenopathy identified.

No pleural effusion, airspace consolidation or atelectasis
identified within the imaged portions of the lungs.

No acute findings within the imaged portions of the upper abdomen.

No acute or suspicious osseous findings.
IMPRESSION: Negative over-read.

*** End of Addendum ***
FINDINGS: Non-cardiac: See separate report from [REDACTED].

Pulmonary veins drain normally to the left atrium. No LA appendage
thrombus noted.

Calcium Score: 0 Agatston units.

Coronary Arteries: Right dominant with no anomalies

LM: No plaque or stenosis.

LAD system:  No plaque or stenosis.

Circumflex system: No plaque or stenosis.

RCA system:  No plaque or stenosis.
IMPRESSION: 1. Coronary artery calcium score 0 Agatston units, suggesting low
risk for future cardiac events.

2. Images degraded by misregistration artifact, but suspect no
plaque or stenosis in the coronary tree.

Jimin Yapur

## 2022-12-02 ENCOUNTER — Other Ambulatory Visit: Payer: Self-pay | Admitting: Allergy and Immunology

## 2022-12-06 ENCOUNTER — Telehealth: Payer: Self-pay | Admitting: Gastroenterology

## 2022-12-06 NOTE — Telephone Encounter (Signed)
Patient calls stating that she has been having LUQ pain intermittently for 3-4 weeks; pain can be sharp in nature and radiates to the back. She denies any change in bowels (she always has alternating bowels with IBS) and only scan brb at times with wiping, no melena. No nausea, vomiting or fever present. Patient states that she went to her PCP recently for the pain as she thought she may have pancreatitis again, but had multiple labs which all came back normal with the exception of her triglycerides at 290 (PCP office note in care everywhere, but labs are unavailable).  She also states she had an abdominal xray showing a "moderate stool burden" but no other findings. Patient says she has been taking motrin twice daily for the last few weeks due to recent orthopedic issue as well.  Advised patient to begin Miralax 17 grams daily for now and titrate as needed. Also advised that daily motrin may be irritating to the stomach. Patient has been scheduled for the next available appointment with Dr Chales Abrahams 01/21/23.  Dr Chales Abrahams, any additional/different recommendations?

## 2022-12-06 NOTE — Telephone Encounter (Signed)
Inbound call from patient stating that she has been having left abdominal pain for the last 3 weeks. Requesting a call back to further discuss. Please advise.

## 2022-12-06 NOTE — Telephone Encounter (Signed)
Left message for patient to call back  

## 2022-12-07 NOTE — Telephone Encounter (Signed)
That is perfect RG  Robin Reed, Please call her and see how she is doing in 1 week RG

## 2022-12-08 NOTE — Telephone Encounter (Signed)
Reminder placed in Epic to call pt in 1 week for symptom update.

## 2022-12-10 ENCOUNTER — Other Ambulatory Visit: Payer: Self-pay | Admitting: Gastroenterology

## 2022-12-15 NOTE — Telephone Encounter (Signed)
Pt was contacted for a symptom update.  Pt stated that the pain has been gone for 4 or 5 days now. Pt stated that she had two soft BM this AM. Pt stated that she did not use the miralax because it gives her more diarrhea. Pt was notified of the results of the xray and instructed of what overflow diarrhea is was encouraged to use the miralax maybe on the weekends if she does not want to during the week, drink lots of water, take a stool softener, and walk a moderate amount. Pt verbalized understanding with all questions answered.

## 2023-01-21 ENCOUNTER — Ambulatory Visit: Payer: Commercial Managed Care - PPO | Admitting: Gastroenterology

## 2023-01-21 ENCOUNTER — Encounter: Payer: Self-pay | Admitting: Gastroenterology

## 2023-01-21 VITALS — BP 142/80 | HR 86 | Ht 68.0 in | Wt 280.5 lb

## 2023-01-21 DIAGNOSIS — K760 Fatty (change of) liver, not elsewhere classified: Secondary | ICD-10-CM | POA: Diagnosis not present

## 2023-01-21 DIAGNOSIS — R197 Diarrhea, unspecified: Secondary | ICD-10-CM

## 2023-01-21 DIAGNOSIS — K219 Gastro-esophageal reflux disease without esophagitis: Secondary | ICD-10-CM | POA: Diagnosis not present

## 2023-01-21 DIAGNOSIS — R1310 Dysphagia, unspecified: Secondary | ICD-10-CM

## 2023-01-21 DIAGNOSIS — Z8719 Personal history of other diseases of the digestive system: Secondary | ICD-10-CM

## 2023-01-21 DIAGNOSIS — R1012 Left upper quadrant pain: Secondary | ICD-10-CM

## 2023-01-21 DIAGNOSIS — Z86010 Personal history of colonic polyps: Secondary | ICD-10-CM

## 2023-01-21 NOTE — Progress Notes (Signed)
Chief Complaint: FU  Referring Provider:  [name], NP      ASSESSMENT AND PLAN;   #1. LUQ pain. ?etiology  #2. IBS with alt diarrhea/constipation.  Previously more diarrhea. Doubt exocrine pancreatic insufficiency. Neg random Bx for microscopic colitis August 31, 2021, neg SB Bx for celiac Jan 2024. Viberzi helped but INS stopped paying.  #3. GERD   #4. H/O Acute pancreatitis d/t hypertriglyceridemia (peak level 01/2017 3511). No DM. GB intact  #5. Fatty liver with Nl LFTs.  #6. 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 FH of polyps (dad), requiring hemicolectomy at age 47.  FH of colon cancer GM. Neg colon 31-Aug-2021. Next due Aug 31, 2026  Plan:  -CT AP with contrast -CBC, CMP, lipase -Continue bentyl 10mg  po QID prn. -Continue Dexilant 60 mg po every day.     HPI:    Lorijean Husser is a 47 y.o. female  For follow-up visit Works in [employer]  S/P R shoulder rotator cuff tear after [place] at the beach, managed conservatively Started having LUQ pain X ray - mod stool throughout the colon Started MiraLAX, stool softener Had diarrhea  Still continued left upper quadrant abdominal pain -Mild to moderate -Last throughout the day -Not affected by Bms -Denies having any fever or chills -Under the rib cage -With associated abdominal bloating -Has been on lactose-free diet. -No definite exacerbating or relieving factors  Has had blood work recently which showed normal triglycerides at 200  She denies having any nausea or vomiting  No significant nonsteroidals.  Wt Readings from Last 3 Encounters:  01/21/23 280 lb 8 oz (127.2 kg)  07/19/22 274 lb (124.3 kg)  06/03/22 278 lb 3.2 oz (126.2 kg)      Previous labs from 04/21/2020 showing hemoglobin 13.8, MCV 90, platelets 267.  Her liver tests are all within normal limits with AST 19, a LT 17, alk phos 43.  Her most recent triglycerides were 217 with normal hemoglobin A1c 5.4, TSH 2.87, B12 362. Baseline labs from 2019-hemoglobin  13.6, WBC count 7.7, platelets 287, BUN 15/creatinine 0.9, normal LFTs AST 22, ALT 18.  Triglycerides 399, TSH 4.07. 03/13/19hemoglobin 13.5, normal liver function tests, triglycerides 609    Past GI workup:  Colonoscopy 07/31/2021 - The entire examined colon is normal. Biopsied. - Minimal sigmoid diverticulosis. - Non-bleeding internal hemorrhoids. - The examined portion of the ileum was normal. - Rpt 5 yrs d/t FH of advanced colon polyps - Neg random colon biopsies for microscopic colitis   EGD 07/19/2022 - Normal EGD. Bx- mild reflux, neg EoE - S/P esophageal dilatation 54 Fr - Neg SB Bx for celiac. Neg HP  H/O TA (colon Aug 31, 2016). FH of polyps (dad), requiring hemicolectomy at age 63.  FH of colon cancer GM. Neg colon August 31, 2021    SH- dad passed away 03/13/2022d/t metastatic squamous cell CA of head and neck.  He did smoke a lot.   Past Medical History:  Diagnosis Date   Anemia    Anxiety    Elevated cholesterol with high triglycerides    GERD (gastroesophageal reflux disease)    High triglycerides    Hyperlipidemia    IBS (irritable bowel syndrome)    Migraine    Pancreatitis    Restless legs    Tachycardia    UTI (urinary tract infection)     Past Surgical History:  Procedure Laterality Date   ANAL SPHINCTEROPLASTY     COLONOSCOPY  05/21/2013   Very minimal sigmoid diverticulosis. Small internal hemorrhoids. Otherwise  normal colon to TI.    episiotomy repair  07/2003   ESOPHAGOGASTRODUODENOSCOPY  04/02/2013   Mild gastrtiis. Otherwise normal EGD   FOOT SURGERY Right    HERNIA REPAIR  06/30/2016   umbilical and right inginual    Leep surgery  11/26/2013   Nasal Surgery     PERINEOPLASTY  01/07/2004   Right Inguinal Hernia Repair  06/30/2016   Skin Graft mouth     TONSILLECTOMY  02/19/2008   UMBILICAL HERNIA REPAIR  06/30/2016    Family History  Problem Relation Age of Onset   High blood pressure Mother    Clotting disorder Mother    High blood  pressure Father    Colonic polyp Father    Lung cancer Father    Throat cancer Father    Cancer Father        Neck    COPD Father    Congestive Heart Failure Father    High blood pressure Brother    Colon cancer Paternal Grandmother    Esophageal cancer Neg Hx    Breast cancer Neg Hx     Social History   Tobacco Use   Smoking status: Never   Smokeless tobacco: Never  Vaping Use   Vaping status: Never Used  Substance Use Topics   Alcohol use: Not Currently   Drug use: Never    Current Outpatient Medications  Medication Sig Dispense Refill   aspirin 81 MG tablet Take 81 mg by mouth at bedtime.     Cholecalciferol (VITAMIN D-3) 125 MCG (5000 UT) TABS Take by mouth daily.     Coenzyme Q10 (COQ10) 100 MG CAPS Take 1 capsule by mouth daily.     cyanocobalamin (,VITAMIN B-12,) 1000 MCG/ML injection Inject 1,000 mcg into the muscle every 30 (thirty) days.     DEXILANT 60 MG capsule TAKE ONE CAPSULE BY MOUTH DAILY 90 capsule 2   dicyclomine (BENTYL) 10 MG capsule Take 1 capsule (10 mg total) by mouth 4 (four) times daily as needed for spasms. 120 capsule 6   Docusate Calcium (STOOL SOFTENER PO) Take 1 tablet by mouth daily as needed.     drospirenone-ethinyl estradiol (YAZ) 3-0.02 MG tablet Take 1 tablet by mouth daily.     EPIPEN 2-PAK 0.3 MG/0.3ML SOAJ injection as directed Injection once a day as needed for allergic reaction for 30 days     famotidine (PEPCID) 40 MG tablet Take 1 tablet (40 mg total) by mouth at bedtime. 30 tablet 5   fenofibrate 160 MG tablet TAKE ONE TABLET BY MOUTH EVERY DAY Orally Once a day for 30 days     Loratadine 10 MG CAPS Take 1 capsule by mouth daily.     MACROBID 100 MG capsule      Magnesium 500 MG TABS Take 500 mg by mouth at bedtime.     Magnesium Glycinate 100 MG CAPS at bedtime.     meloxicam (MOBIC) 15 MG tablet Take 15 mg by mouth daily.     metoprolol tartrate (LOPRESSOR) 25 MG tablet Take 25 mg by mouth 2 (two) times daily.     Multiple  Vitamin (MULTIVITAMIN) tablet Take 1 tablet by mouth daily. With iron/ Unknown strenght     Omega-3 Fatty Acids (FISH OIL) 1000 MG CAPS Take 2 capsules by mouth 2 (two) times daily.     Potassium 99 MG TABS Take 1 tablet by mouth daily.     Red Yeast Rice 600 MG TABS Take 1 tablet by mouth  2 (two) times daily.     rOPINIRole (REQUIP) 2 MG tablet Take 2 mg by mouth at bedtime.     rosuvastatin (CRESTOR) 40 MG tablet Take 40 mg by mouth at bedtime.     Ubrogepant (UBRELVY) 100 MG TABS 1 tablet may take second dose at least 2 hours after first dose as needed     Vibegron (GEMTESA) 75 MG TABS Take 1 tablet by mouth daily.     No current facility-administered medications for this visit.    No Known Allergies  Review of Systems:       Physical Exam:    BP (!) 142/90   Pulse 86   Ht 5\' 8"  (1.727 m)   Wt 280 lb 8 oz (127.2 kg)   SpO2 98%   BMI 42.65 kg/m  Filed Weights   01/21/23 1335  Weight: 280 lb 8 oz (127.2 kg)  Gen: awake, alert, NAD HEENT: anicteric, no pallor CV: RRR, no mrg Pulm: CTA b/l Abd: soft, mild left upper quadrant tenderness without rebound, +BS throughout Ext: no c/c/e Neuro: nonfocal     Edman Circle, MD 01/21/2023, 1:54 PM  Cc: Dr Ardelle Park

## 2023-01-21 NOTE — Patient Instructions (Addendum)
_______________________________________________________  If your blood pressure at your visit was 140/90 or greater, please contact your primary care physician to follow up on this.  _______________________________________________________  If you are age 47 or older, your body mass index should be between 23-30. Your Body mass index is 42.65 kg/m. If this is out of the aforementioned range listed, please consider follow up with your Primary Care Provider.  If you are age 34 or younger, your body mass index should be between 19-25. Your Body mass index is 42.65 kg/m. If this is out of the aformentioned range listed, please consider follow up with your Primary Care Provider.   ________________________________________________________  The [facility] providers would like to encourage you to use [facility] to communicate with providers for non-urgent requests or questions.  Due to long hold times on the telephone, sending your provider a message by [facility] may be a faster and more efficient way to get a response.  Please allow 48 business hours for a response.  Please remember that this is for non-urgent requests.  _______________________________________________________  Continue current medications  Please call in 1 week to see about your CT scan and labs at [facility].  Thank you,  Dr. Lynann Bologna

## 2023-01-24 ENCOUNTER — Telehealth: Payer: Self-pay | Admitting: Cardiology

## 2023-01-24 ENCOUNTER — Other Ambulatory Visit (HOSPITAL_COMMUNITY): Payer: Commercial Managed Care - PPO

## 2023-01-24 NOTE — Telephone Encounter (Signed)
Called pt in regards to elevated BP.  Pt reports was outside at a festival Friday and Saturday in 100 degree weather.  Noted to feel dizzy and like would pass out on Saturday. On Sunday had an unbearable HA (has hx of migraines) Reports BP was 214/104.  Called EMS went to hospital in [place].  Per pt when released from hospital BP was around 137/87 once home BP increased to 140's.  Today BP 150/84-98.  Reports takes metoprolol tartrate 25 mg PO BID as ordered.  Took 75 mg at one time yesterday to see if would help BP per pt did not help.  Advised will send a message to provider for guidance.  Dr. Vincent Gros replied is okay to schedule pt for 8 am tomorrow 01/25/23.  Pt is agreeable to this time appointment scheduled.  Advised to call EMS if BP goes back up pt expresses understanding.

## 2023-01-24 NOTE — Telephone Encounter (Signed)
Pt calling back to f/u on call to nurse from this morning. She has yet to receive a callback regarding her BP issues. Please advise

## 2023-01-24 NOTE — Telephone Encounter (Signed)
Pt c/o BP issue: STAT if pt c/o blurred vision, one-sided weakness or slurred speech  1. What are your last 5 BP readings? 214/104, Patient didn't have all her readings with her.  2. Are you having any other symptoms (ex. Dizziness, headache, blurred vision, passed out)? Headache, dizziness, nauseous, and felt like she was going to pass out.  3. What is your BP issue? Pt stated they went to [place] yesterday. Patient stated she started feeling like she was going to pass out and very nauseous. Patient stated she didn't check her BP on Saturday because she didn't think it was from her BP at the time. Patient stated she was noticed yesterday her BP was high and kept going up on. Patient stated her BP was around 214/104 yesterday at [place]. Please advise.

## 2023-01-25 ENCOUNTER — Other Ambulatory Visit: Payer: Self-pay

## 2023-01-25 ENCOUNTER — Telehealth: Payer: Self-pay

## 2023-01-25 ENCOUNTER — Ambulatory Visit: Payer: Commercial Managed Care - PPO

## 2023-01-25 VITALS — BP 156/92 | HR 83 | Ht 68.0 in | Wt 284.2 lb

## 2023-01-25 DIAGNOSIS — I10 Essential (primary) hypertension: Secondary | ICD-10-CM | POA: Diagnosis not present

## 2023-01-25 DIAGNOSIS — G43909 Migraine, unspecified, not intractable, without status migrainosus: Secondary | ICD-10-CM | POA: Insufficient documentation

## 2023-01-25 DIAGNOSIS — E781 Pure hyperglyceridemia: Secondary | ICD-10-CM | POA: Insufficient documentation

## 2023-01-25 DIAGNOSIS — E782 Mixed hyperlipidemia: Secondary | ICD-10-CM | POA: Insufficient documentation

## 2023-01-25 HISTORY — DX: Essential (primary) hypertension: I10

## 2023-01-25 MED ORDER — AMLODIPINE BESYLATE 5 MG PO TABS: 5.0000 mg | ORAL_TABLET | Freq: Every day | ORAL | 3 refills | Status: DC

## 2023-01-25 MED ORDER — CARVEDILOL 6.25 MG PO TABS: 6.2500 mg | ORAL_TABLET | Freq: Two times a day (BID) | ORAL | 3 refills | Status: DC

## 2023-01-25 NOTE — Patient Instructions (Signed)
Medication Instructions:  Your physician has recommended you make the following change in your medication:   START: Amlodipine 5 mg daily START: Carvedilol 6.25 mg twice daily STOP: Metoprolol tartrate  *If you need a refill on your cardiac medications before your next appointment, please call your pharmacy*   Lab Work: None If you have labs (blood work) drawn today and your tests are completely normal, you will receive your results only by: MyChart Message (if you have MyChart) OR A paper copy in the mail If you have any lab test that is abnormal or we need to change your treatment, we will call you to review the results.   Testing/Procedures: None   Follow-Up: At [facility], you and your health needs are our priority.  As part of our continuing mission to provide you with exceptional heart care, we have created designated Provider Care Teams.  These Care Teams include your primary Cardiologist (physician) and Advanced Practice Providers (APPs -  Physician Assistants and Nurse Practitioners) who all work together to provide you with the care you need, when you need it.  We recommend signing up for the patient portal called "MyChart".  Sign up information is provided on this After Visit Summary.  MyChart is used to connect with patients for Virtual Visits (Telemedicine).  Patients are able to view lab/test results, encounter notes, upcoming appointments, etc.  Non-urgent messages can be sent to your provider as well.   To learn more about what you can do with MyChart, go to [email or web address].    Your next appointment:   3 months  Provider:   Dr. Vincent Gros   Other Instructions Exercise 20-30 minutes daily  Talk to PCP regarding Sleep study

## 2023-01-25 NOTE — Telephone Encounter (Signed)
Advised patient that she can schedule her CT and get her lab work done at [facility]. Her CT didn't need an authorization. Patient voiced understanding  Order for CT and lab requisitions form faxed successfully to [facility] departments

## 2023-01-25 NOTE — Progress Notes (Signed)
Cardiology Office Note:    Date:  01/25/2023   ID:  Robin Reed, DOB [birth year], MRN [id number]  PCP:  Robin Music, NP  Cardiologist:  Luretha Murphy, MD    Referring MD: Robin Music, NP   No chief complaint on file. High blood pressure uncontrolled  History of Present Illness:    Robin Reed is a 47 y.o. female patient, last visit with our office was with Dr. Dulce Reed January 19, 2021.  Here for office visit after recent episode of hypertensive urgency requiring ER visit.  She has history of hypertriglyceridemia complicated by pancreatitis in the past, currently well-controlled on lipid-lowering therapy with rosuvastatin, fenofibrate, co-Q10 and omega-3 fatty acids.  Also has history of anxiety, report elevated heart rates at baseline and being managed with metoprolol tartrate 25 mg twice daily dose chronically.  Obesity, GERD.  At last visit with cardiology she was evaluated for dyspnea and atypical chest pain, underwent CT coronary angiogram that showed calcium score 0 and no obstructive coronary artery disease in August 2022.  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  Recently has been off work for the past 3 months due to right upper arm injury, this is healed and she is planning to return to work next week.  Patient is routinely her blood pressures at home typically tend to run high.  Over the weekend, while she was out in the hot weather started having symptoms of headache associated with dizziness and noted her blood pressures to be significantly elevated with systolic in 200s. Was taken to the emergency room by EMS.  No acute neurological symptoms she was noted to be significantly elevated with blood pressures systolic 200s and diastolic in 110s.  Per patient she received hydralazine, Benadryl, IV fluids for volume replenishment.  Her blood pressures improved to systolic 130s to 409W and she was discharged home.  Blood pressure  readings at home last night and this morning remains from systolic 140s to 119J.  She denies any further episodes of headache.  Denies any other associated symptoms of chest pain or shortness of breath, palpitations, lightheadedness, syncope.  No pedal edema.  She does not smoke or drink alcohol or use any recreational drugs. She does report intermittent snoring but never formally tested for sleep apnea. No routine exercise. EKG today in the clinic Shows sinus rhythm heart rate 83/min, QRS duration 88 ms.  QTc 439 ms.  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   Dietary counseling and surveillance 01/19/2021   Elevated cholesterol with high triglycerides    Essential hypertension 01/25/2023   Gastro-esophageal reflux disease without esophagitis 01/05/2021   High triglycerides    Hyperlipidemia    Insomnia 01/19/2021   Irritable bowel syndrome with diarrhea 01/05/2021   Migraine    Pancreatitis    Pernicious anemia 01/19/2021   Pure hypertriglyceridemia 01/19/2021   Restless legs syndrome 01/19/2021   Right inguinal hernia 05/31/2016   Seasonal allergic rhinitis 01/19/2021   Tachycardia    Umbilical hernia without obstruction and without gangrene 05/31/2016   UTI (urinary tract infection)    Varicose veins of bilateral lower extremities with other complications 07/19/2022   Vitamin D deficiency 01/19/2021    Past Surgical History:  Procedure Laterality Date   ANAL SPHINCTEROPLASTY  COLONOSCOPY  05/21/2013   Very minimal sigmoid diverticulosis. Small internal hemorrhoids. Otherwise normal colon to TI.    episiotomy repair  07/2003   ESOPHAGOGASTRODUODENOSCOPY  04/02/2013   Mild gastrtiis. Otherwise normal EGD   FOOT SURGERY Right    HERNIA REPAIR   06/30/2016   umbilical and right inginual    Leep surgery  11/26/2013   Nasal Surgery     PERINEOPLASTY  01/07/2004   Right Inguinal Hernia Repair  06/30/2016   Skin Graft mouth     TONSILLECTOMY  02/19/2008   UMBILICAL HERNIA REPAIR  06/30/2016    Current Medications: Current Meds  Medication Sig   aspirin 81 MG tablet Take 81 mg by mouth at bedtime.   Cholecalciferol (VITAMIN D-3) 125 MCG (5000 UT) TABS Take 5,000 Units by mouth daily.   Coenzyme Q10 (COQ10) 100 MG CAPS Take 1 capsule by mouth daily.   cyanocobalamin (,VITAMIN B-12,) 1000 MCG/ML injection Inject 1,000 mcg into the muscle every 30 (thirty) days.   DEXILANT 60 MG capsule TAKE ONE CAPSULE BY MOUTH DAILY   dicyclomine (BENTYL) 10 MG capsule Take 1 capsule (10 mg total) by mouth 4 (four) times daily as needed for spasms.   Docusate Calcium (STOOL SOFTENER PO) Take 1 tablet by mouth daily as needed (constipation).   drospirenone-ethinyl estradiol (YAZ) 3-0.02 MG tablet Take 1 tablet by mouth daily.   EPIPEN 2-PAK 0.3 MG/0.3ML SOAJ injection Inject 0.3 mg into the muscle as needed for anaphylaxis.   famotidine (PEPCID) 40 MG tablet Take 1 tablet (40 mg total) by mouth at bedtime.   fenofibrate 160 MG tablet TAKE ONE TABLET BY MOUTH EVERY DAY Orally Once a day for 30 days   Loratadine 10 MG CAPS Take 1 capsule by mouth daily.   Magnesium 500 MG TABS Take 500 mg by mouth at bedtime.   Magnesium Glycinate 100 MG CAPS Take 100 mg by mouth at bedtime.   meloxicam (MOBIC) 15 MG tablet Take 15 mg by mouth daily.   metoprolol tartrate (LOPRESSOR) 25 MG tablet Take 25 mg by mouth 2 (two) times daily.   Multiple Vitamin (MULTIVITAMIN) tablet Take 1 tablet by mouth daily. With iron/ Unknown strenght   Omega-3 Fatty Acids (FISH OIL) 1000 MG CAPS Take 2 capsules by mouth 2 (two) times daily.   Potassium 99 MG TABS Take 1 tablet by mouth daily.   Red Yeast Rice 600 MG TABS Take 1 tablet by mouth 2 (two) times daily.   rOPINIRole  (REQUIP) 2 MG tablet Take 2 mg by mouth at bedtime.   rosuvastatin (CRESTOR) 40 MG tablet Take 40 mg by mouth at bedtime.   Ubrogepant (UBRELVY) 100 MG TABS 1 tablet may take second dose at least 2 hours after first dose as needed   Vibegron (GEMTESA) 75 MG TABS Take 1 tablet by mouth daily.     Allergies:   Patient has no known allergies.   Social History   Socioeconomic History   Marital status: Divorced    Spouse name: Not on file   Number of children: 1   Years of education: Not on file   Highest education level: Not on file  Occupational History   Occupation: CNA  Tobacco Use   Smoking status: Never   Smokeless tobacco: Never  Vaping Use   Vaping status: Never Used  Substance and Sexual Activity   Alcohol use: Not Currently   Drug use: Never   Sexual activity: Not on file  Other Topics Concern  Not on file  Social History Narrative   Not on file   Social Determinants of Health   Financial Resource Strain: Not on file  Food Insecurity: Not on file  Transportation Needs: Not on file  Physical Activity: Not on file  Stress: Not on file  Social Connections: Not on file     Family History: The patient's family history includes COPD in her father; Cancer in her father; Clotting disorder in her mother; Colon cancer in her paternal grandmother; Colonic polyp in her father; Congestive Heart Failure in her father; High blood pressure in her brother, father, and mother; Lung cancer in her father; Throat cancer in her father. There is no history of Esophageal cancer or Breast cancer. ROS:   Please see the history of present illness.    All 14 point review of systems negative except as described per history of present illness  EKGs/Labs/Other Studies Reviewed:         Recent Labs: No results found for requested labs within last 365 days.  Recent Lipid Panel No results found for: "CHOL", "TRIG", "HDL", "CHOLHDL", "VLDL", "LDLCALC", "LDLDIRECT"  Physical Exam:     VS:  BP (!) 156/92   Pulse 83   Ht 5\' 8"  (1.727 m)   Wt 284 lb 3.2 oz (128.9 kg)   SpO2 99%   BMI 43.21 kg/m     Wt Readings from Last 3 Encounters:  01/25/23 284 lb 3.2 oz (128.9 kg)  01/21/23 280 lb 8 oz (127.2 kg)  07/19/22 274 lb (124.3 kg)     GEN:  Well nourished, well developed in no acute distress NECK: No JVD; No carotid bruits CARDIAC: RRR, no murmurs, no rubs, no gallops RESPIRATORY:  Clear to auscultation without rales, wheezing or rhonchi  SKIN: Warm and dry EXTREMITIES: Peripheral pulses palpable with bilateral radial 2+, dorsalis pedis 2+. No pitting pedal edema.  NEUROLOGIC:  Alert and oriented x 3 PSYCHIATRIC:  Normal affect   ASSESSMENT:    1. Essential hypertension   Complicated by recent hypertensive urgency episode over the weekend requiring ER visit. Elevated blood pressures at home at baseline. PLAN:    In order of problems listed above:  Target blood pressures below 130 over 80 mmHg. Discussed various options to pharmacologically and nonpharmacologically address the underlying blood pressure concerns. Nonpharmacological measures reviewed and recommended to be further evaluated for sleep apnea by undergoing a formal sleep study.  She is going to further discuss this with PCP. Other nonpharmacological measures discussed including healthy diet, regular exercise [20 to 30 minutes of mild to moderate intensity activity such as walking or jogging] were recommended. Pharmacological measures currently she is only on metoprolol tartrate 25 mg twice daily which was initially started for controlling her baseline elevated heart rates.  We reviewed and discussed switching this to carvedilol 6.25 mg twice daily for better blood pressure control profile.  In addition we discussed amlodipine 5 mg once daily to be started, its mechanism of action and potential side effects including dizziness lightheadedness and lower extremity edema were discussed.  She is agreeable to  start these. Return to clinic in 3 months  Medication Adjustments/Labs and Tests Ordered: Current medicines are reviewed at length with the patient today.  Concerns regarding medicines are outlined above.  Orders Placed This Encounter  Procedures   EKG 12-Lead   Medication changes: No orders of the defined types were placed in this encounter.   Signed, Cecille Amsterdam, MD, MPH, [facility] 01/25/2023 8:24 AM  [facility]

## 2023-01-26 LAB — LAB REPORT - SCANNED

## 2023-02-07 ENCOUNTER — Telehealth: Payer: Self-pay

## 2023-02-07 NOTE — Telephone Encounter (Signed)
Spoke with pt, she stated that on 2 different days about 1 hour after taking her Amlodipine and Carvedilol she had a Headache, dizziness and nausea. Her blood pressures are running 159/97, 193/103, 160/97. During the time when she was feeling bad it was 117/76. She is willing to come in if needed.

## 2023-02-07 NOTE — Telephone Encounter (Signed)
Pt c/o medication issue:  1. Name of Medication: amLODipine (NORVASC) 5 MG tablet   carvedilol (COREG) 6.25 MG tablet    2. How are you currently taking this medication (dosage and times per day)?   Take 1 tablet (5 mg total) by mouth daily.    Take 1 tablet (6.25 mg total) by mouth 2 (two) times daily.    3. Are you having a reaction (difficulty breathing--STAT)? No  4. What is your medication issue? Pt is requesting a callback regarding these 2 medications not working for her and makes her feel sick. Please advise

## 2023-02-08 NOTE — Telephone Encounter (Signed)
Faxed dexilant patient assistance program to [phone] per patients request and mychart request

## 2023-02-08 NOTE — Telephone Encounter (Signed)
Follow Up:       Patient is calling to see what was decided.

## 2023-02-08 NOTE — Telephone Encounter (Signed)
Spoke with pt per Dr. Madireddy's note. Advised to come in to follow up on BP and medications. Pt agreed and had no further questions.

## 2023-02-09 ENCOUNTER — Ambulatory Visit: Payer: Commercial Managed Care - PPO

## 2023-02-09 VITALS — BP 148/96 | HR 96 | Ht 68.0 in | Wt 284.4 lb

## 2023-02-09 DIAGNOSIS — I10 Essential (primary) hypertension: Secondary | ICD-10-CM

## 2023-02-09 NOTE — Progress Notes (Signed)
Cardiology Consultation:    Date:  02/09/2023   ID:  Robin Reed, DOB August 14, 1975, MRN [id number]  PCP:  [name], NP  Cardiologist:  Marlyn Corporal Jakory Matsuo, MD   Referring MD: [name], NP   Chief Complaint  Patient presents with   BP fluctuation    More high than low, c/o dizziness  Ongoing since she started new meds prescribed on las visit     History of Present Illness:    Robin Reed is a 47 y.o. female who is being seen today for the evaluation of hypertension at the request of [name], NP.   She is here for a follow-up visit today after her recent office visit on January 25, 2023.  To summarize she was recently seen on August 6 by me for hypertension management. She has history of hypertriglyceridemia complicated by pancreatitis in the past, anxiety, obesity, GERD, elevated baseline heart rates, last CAD workup with CT coronary angiogram in August 2022 showed calcium score of 0 and no obstructive disease.  With recent history of hypertensive urgency related ER visit, she was sent for blood pressure management. We switched her to carvedilol 6.25 mg twice daily and amlodipine 5 mg once daily.   Today she mentions her blood pressures for the first 2 to 3 days have improved to systolic 120s.  Subsequent days logs show systolic blood pressure up to 409W.  Over the weekend since August 17 she has had readings as low as 110s and as high as 180s for systolic blood pressure. She is extremely anxious and flustered with the fact that she gets symptomatic, describes as headache and dizziness, based on her blood pressure readings they do not appear to be a consistent pattern of her symptoms being associated with either lower blood pressures or elevated blood pressures. Also complicating the issue is her significant anxiety when she gets symptomatic, as this might drive her blood pressure up once she gets anxious.  She does not have any other dangerous signs such  as chest pain, palpitations, syncopal episodes. She has been taking amlodipine in the morning. 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 She has been hydrating by drinking Gatorade.   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   Dietary counseling and surveillance 01/19/2021   Elevated cholesterol with high triglycerides    Essential hypertension 01/25/2023   Gastro-esophageal reflux disease without esophagitis 01/05/2021   High triglycerides    Hyperlipidemia    Insomnia 01/19/2021   Irritable bowel syndrome with diarrhea 01/05/2021   Migraine    Pancreatitis    Pernicious anemia 01/19/2021   Pure hypertriglyceridemia 01/19/2021   Restless legs syndrome 01/19/2021   Right inguinal hernia 05/31/2016   Seasonal allergic rhinitis 01/19/2021   Tachycardia    Umbilical hernia without obstruction and without gangrene 05/31/2016   UTI (urinary tract infection)    Varicose veins of bilateral lower extremities with other complications 07/19/2022   Vitamin D deficiency 01/19/2021    Past Surgical History:  Procedure Laterality Date   ANAL SPHINCTEROPLASTY     COLONOSCOPY  05/21/2013   Very minimal sigmoid diverticulosis. Small internal hemorrhoids. Otherwise normal colon to TI.    episiotomy repair  07/2003   ESOPHAGOGASTRODUODENOSCOPY  04/02/2013   Mild  gastrtiis. Otherwise normal EGD   FOOT SURGERY Right    HERNIA REPAIR  06/30/2016   umbilical and right inginual    Leep surgery  11/26/2013   Nasal Surgery     PERINEOPLASTY  01/07/2004   Right Inguinal Hernia Repair  06/30/2016   Skin Graft mouth     TONSILLECTOMY  02/19/2008   UMBILICAL HERNIA REPAIR  06/30/2016    Current Medications: Current Meds  Medication Sig   amLODipine  (NORVASC) 5 MG tablet Take 1 tablet (5 mg total) by mouth daily.   aspirin 81 MG tablet Take 81 mg by mouth at bedtime.   carvedilol (COREG) 6.25 MG tablet Take 1 tablet (6.25 mg total) by mouth 2 (two) times daily.   Cholecalciferol (VITAMIN D-3) 125 MCG (5000 UT) TABS Take 5,000 Units by mouth daily.   Coenzyme Q10 (COQ10) 100 MG CAPS Take 1 capsule by mouth daily.   cyanocobalamin (,VITAMIN B-12,) 1000 MCG/ML injection Inject 1,000 mcg into the muscle every 30 (thirty) days.   DEXILANT 60 MG capsule TAKE ONE CAPSULE BY MOUTH DAILY   dicyclomine (BENTYL) 10 MG capsule Take 1 capsule (10 mg total) by mouth 4 (four) times daily as needed for spasms.   Docusate Calcium (STOOL SOFTENER PO) Take 1 tablet by mouth daily as needed (constipation).   drospirenone-ethinyl estradiol (YAZ) 3-0.02 MG tablet Take 1 tablet by mouth daily.   EPIPEN 2-PAK 0.3 MG/0.3ML SOAJ injection Inject 0.3 mg into the muscle as needed for anaphylaxis.   famotidine (PEPCID) 40 MG tablet Take 1 tablet (40 mg total) by mouth at bedtime.   fenofibrate 160 MG tablet Take 160 mg by mouth daily.   Loratadine 10 MG CAPS Take 1 capsule by mouth daily.   Magnesium 500 MG TABS Take 500 mg by mouth at bedtime.   Magnesium Glycinate 100 MG CAPS Take 100 mg by mouth at bedtime.   meloxicam (MOBIC) 15 MG tablet Take 15 mg by mouth daily.   Multiple Vitamin (MULTIVITAMIN) tablet Take 1 tablet by mouth daily. With iron/ Unknown strenght   Omega-3 Fatty Acids (FISH OIL) 1000 MG CAPS Take 2 capsules by mouth 2 (two) times daily.   Potassium 99 MG TABS Take 1 tablet by mouth daily.   Red Yeast Rice 600 MG TABS Take 1 tablet by mouth 2 (two) times daily.   rOPINIRole (REQUIP) 2 MG tablet Take 2 mg by mouth at bedtime.   rosuvastatin (CRESTOR) 40 MG tablet Take 40 mg by mouth at bedtime.   Ubrogepant (UBRELVY) 100 MG TABS Take 100 mg by mouth as needed (Headahces).   Vibegron (GEMTESA) 75 MG TABS Take 1 tablet by mouth daily.      Allergies:   Patient has no known allergies.   Social History   Socioeconomic History   Marital status: Divorced    Spouse name: Not on file   Number of children: 1   Years of education: Not on file   Highest education level: Not on file  Occupational History   Occupation: CNA  Tobacco Use   Smoking status: Never   Smokeless tobacco: Never  Vaping Use   Vaping status: Never Used  Substance and Sexual Activity   Alcohol use: Not Currently   Drug use: Never   Sexual activity: Not on file  Other Topics Concern   Not on file  Social History Narrative   Not on file   Social Determinants of Health   Financial Resource Strain: Not on file  Food  Insecurity: Not on file  Transportation Needs: Not on file  Physical Activity: Not on file  Stress: Not on file  Social Connections: Not on file     Family History: The patient's family history includes COPD in her father; Cancer in her father; Clotting disorder in her mother; Colon cancer in her paternal grandmother; Colonic polyp in her father; Congestive Heart Failure in her father; High blood pressure in her brother, father, and mother; Lung cancer in her father; Throat cancer in her father. There is no history of Esophageal cancer or Breast cancer. ROS:   Please see the history of present illness.    All 14 point review of systems negative except as described per history of present illness.  EKGs/Labs/Other Studies Reviewed:    The following studies were reviewed today:  Last EKG was at last office visit showing sinus rhythm heart rate 83/min, QRS duration 88 ms.    Physical Exam:    VS:  BP (!) 148/96 (BP Location: Left Arm, Patient Position: Sitting)   Pulse 96   Ht 5\' 8"  (1.727 m)   Wt 284 lb 6.4 oz (129 kg)   SpO2 98%   BMI 43.24 kg/m     Wt Readings from Last 3 Encounters:  02/09/23 284 lb 6.4 oz (129 kg)  01/25/23 284 lb 3.2 oz (128.9 kg)  01/21/23 280 lb 8 oz (127.2 kg)     GENERAL:  Well nourished,  well developed in no acute distress NECK: No JVD; No carotid bruits NEUROLOGIC:  Alert and oriented x 3, she is very and was in tears as she spoke about her symptoms. Blood pressures rechecked by myself today systolic 150/90 on left upper arm manual measurement.   ASSESSMENT AND PLAN:   47 year old woman with history of obesity, hypertension, hypertriglyceridemia, GERD feeling symptomatic on new medications for blood pressure that was started at last visit on August 6.   Problem List Items Addressed This Visit     Essential hypertension - Primary    Symptoms of dizziness could be related to improved blood pressure readings with the current medications as her body is getting used to lower blood pressure readings. Reassured her. Suggested she continue to take her current medications: Amlodipine. Suggested taking amlodipine 5 mg once daily in the evenings at bedtime, starting today.  Continue with carvedilol 6.25 mg twice daily.  Target blood pressure below 130 over 80 mmHg.  Recommended to keep herself well-hydrated throughout the day and take frequent breaks if she is working outdoors in hot weather and to take small snacks in between meals and preferably low-sodium diet.     Return to clinic in 3 months.   Medication Adjustments/Labs and Tests Ordered: Current medicines are reviewed at length with the patient today.  Concerns regarding medicines are outlined above.  No orders of the defined types were placed in this encounter.  No orders of the defined types were placed in this encounter.   Signed, Cecille Amsterdam, MD, MPH, [facility]. 02/09/2023 2:57 PM    [facility]

## 2023-02-09 NOTE — Assessment & Plan Note (Addendum)
Symptoms of dizziness could be related to improved blood pressure readings with the current medications as her body is getting used to lower blood pressure readings. Reassured her. Suggested she continue to take her current medications: Amlodipine. Suggested taking amlodipine 5 mg once daily in the evenings at bedtime, starting today.  Continue with carvedilol 6.25 mg twice daily.  Target blood pressure below 130 over 80 mmHg.  Recommended to keep herself well-hydrated throughout the day and take frequent breaks if she is working outdoors in hot weather and to take small snacks in between meals and preferably low-sodium diet.

## 2023-02-09 NOTE — Patient Instructions (Signed)
Medication Instructions:  Your physician has recommended you make the following change in your medication:   Take your Amlodipine in the pm  *If you need a refill on your cardiac medications before your next appointment, please call your pharmacy*   Lab Work: None If you have labs (blood work) drawn today and your tests are completely normal, you will receive your results only by: MyChart Message (if you have MyChart) OR A paper copy in the mail If you have any lab test that is abnormal or we need to change your treatment, we will call you to review the results.   Testing/Procedures: None   Follow-Up: At [facility], you and your health needs are our priority.  As part of our continuing mission to provide you with exceptional heart care, we have created designated Provider Care Teams.  These Care Teams include your primary Cardiologist (physician) and Advanced Practice Providers (APPs -  Physician Assistants and Nurse Practitioners) who all work together to provide you with the care you need, when you need it.  We recommend signing up for the patient portal called "MyChart".  Sign up information is provided on this After Visit Summary.  MyChart is used to connect with patients for Virtual Visits (Telemedicine).  Patients are able to view lab/test results, encounter notes, upcoming appointments, etc.  Non-urgent messages can be sent to your provider as well.   To learn more about what you can do with MyChart, go to [email or web address].    Your next appointment:   3 month(s)  Provider:   Dr. Vincent Gros  Other Instructions None

## 2023-02-16 NOTE — Telephone Encounter (Signed)
Patient made aware that CT and blood work was normal

## 2023-02-22 NOTE — Telephone Encounter (Signed)
Approval for dexilant until 02-19-2024

## 2023-02-25 ENCOUNTER — Encounter: Payer: Self-pay | Admitting: Gastroenterology

## 2023-03-02 NOTE — Progress Notes (Signed)
Addendum: Reviewed today 03-02-2023  Faxed copy documents from patient received notes episodes of spells on 02-23-2023 with pounding in the chest and elevated blood pressures correlated with irregular heart rate and EGM's noted atrial fibrillation with heart rates up to 143 bpm.  Unclear from the tracings how long the episode lasted, but there were tracings time 6:23 PM and 7 PM.  Likely sustained.  Her CHADS2 Vascor would be 2.  Will have her scheduled  for a ZIO monitor for 4 weeks to assess A-fib burden. Will have her scheduled for a follow-up visit in the office to discuss A-fib management and need for anticoagulation depending on A-fib burden.

## 2023-03-14 ENCOUNTER — Other Ambulatory Visit: Payer: Self-pay

## 2023-03-18 ENCOUNTER — Other Ambulatory Visit: Payer: Self-pay | Admitting: Gastroenterology

## 2023-03-18 ENCOUNTER — Telehealth: Payer: Self-pay

## 2023-03-18 NOTE — Telephone Encounter (Signed)
Spoke with pt regarding Dr. Madireddy's message: Ms. Hipp 14-day event monitor results from [facility] were forwarded to me.  Essentially a normal study with no evidence of atrial fibrillation.  She earlier reached out to [place] and said scan EKG from her smart watch which were concerning for atrial fibrillation.   Please schedule her for a follow-up visit with me at the office will review these results.  Summary of the results below for review.   Thank you   That can be a full-time job 14-day event monitor results from [facility] 02/24/2023 are available.  Results show average heart rate 90/min [ranging from 64 to 137 bpm]  Rare ventricular and supraventricular ectopy burden less than 1%.  Her 9 triggered events correlated with regular heart rate and rhythm and at times with extra beats from top chambers of the heart [supraventricular ectopy].  No major abnormalities.  No evidence of atrial fibrillation on the study.   Pt verbalized understanding and had no further questions.

## 2023-03-18 NOTE — Telephone Encounter (Signed)
LVM for pt to call regarding Dr. Madireddy's message: Ms. Stoneberg 14-day event monitor results from [facility] were forwarded to me.  Essentially a normal study with no evidence of atrial fibrillation.  She earlier reached out to [place] and said scan EKG from her smart watch which were concerning for atrial fibrillation.   Please schedule her for a follow-up visit with me at the office will review these results.  Summary of the results below for review.   Thank you   That can be a full-time job 14-day event monitor results from [facility] 02/24/2023 are available.  Results show average heart rate 90/min [ranging from 64 to 137 bpm]  Rare ventricular and supraventricular ectopy burden less than 1%.  Her 9 triggered events correlated with regular heart rate and rhythm and at times with extra beats from top chambers of the heart [supraventricular ectopy].  No major abnormalities.  No evidence of atrial fibrillation on the study.

## 2023-04-26 ENCOUNTER — Other Ambulatory Visit: Payer: Self-pay

## 2023-04-27 ENCOUNTER — Ambulatory Visit: Payer: Commercial Managed Care - PPO

## 2023-04-27 VITALS — BP 138/97 | HR 82 | Ht 68.0 in | Wt 284.0 lb

## 2023-04-27 DIAGNOSIS — I480 Paroxysmal atrial fibrillation: Secondary | ICD-10-CM

## 2023-04-27 DIAGNOSIS — I10 Essential (primary) hypertension: Secondary | ICD-10-CM

## 2023-04-27 HISTORY — DX: Paroxysmal atrial fibrillation: I48.0

## 2023-04-27 MED ORDER — CARVEDILOL 12.5 MG PO TABS: 12.5000 mg | ORAL_TABLET | Freq: Two times a day (BID) | ORAL | 3 refills | Status: DC

## 2023-04-27 MED ORDER — APIXABAN 5 MG PO TABS: 5.0000 mg | ORAL_TABLET | Freq: Two times a day (BID) | ORAL | 11 refills | Status: DC

## 2023-04-27 NOTE — Progress Notes (Signed)
Cardiology Consultation:    Date:  04/27/2023   ID:  Robin Reed, DOB October 14, 1975, MRN [id number]  PCP:  Robin Music, NP  Cardiologist:  Robin Corporal Zaron Zwiefelhofer, MD   Referring MD: Robin Music, NP   Chief Complaint  Patient presents with   Follow-up     ASSESSMENT AND PLAN:   Robin Reed 47/F seen for f/u today has h/o HTN, Obesity, Hypertriglyceridemia, GERD, obstructive sleep apnea recently started using nasal device, elevated baseline heart rates, normal CT coronary angiogram August 2022, normal biventricular function on echocardiogram August 2022, now diagnosed with paroxysmal atrial fibrillation, sustained episode based on electrogram tracing using her smart watch at home on 02-23-2023.  No further episodes.  Further follow-up evaluation with event monitor 14-day study showed no burden of A-fib.    Problem List Items Addressed This Visit     Essential hypertension - Primary    Suboptimal control. Titrate up carvedilol dose to 12.5 mg twice daily. Continue amlodipine 10 mg once daily.  If she continues to have symptoms of lower extremity swelling, will consider titrating down amlodipine while we continue to titrate up carvedilol dose or add other antihypertensive medication such as losartan.       Relevant Medications   amLODipine (NORVASC) 10 MG tablet   carvedilol (COREG) 12.5 MG tablet   apixaban (ELIQUIS) 5 MG TABS tablet   Other Relevant Orders   EKG 12-Lead (Completed)   Paroxysmal atrial fibrillation (HCC), diagnosed 02-23-2023 by electrogram using smart watch at home; CHA2DS2-VASc score 2    No further episodes. Remains in sinus rhythm today. Was symptomatic during the episode on September 4.  Reviewed potential triggers and risk factors for A-fib including physical emotional stress, obesity. Recommended regular exercise and dietary modification to target weight loss. If recurrent episodes of A-fib, can further consider rhythm control with antiarrhythmics  versus ablation. She is able to consistently check further rhythm using her smart watch and will notify us if she has any breakthrough episodes.  The patient's CHA2DS2-VASc score is 2, indicating a 2.2% annual risk of stroke. Discussed her stroke risk at length. We reviewed options for anticoagulation for stroke prophylaxis in the setting.  She is aware of A-fib and stroke risk with a parents being on Eliquis for anticoagulation. Given this we will recommend anticoagulation for stroke prophylaxis. There is no options for anticoagulation reviewed. She is agreeable to proceed with Eliquis 5 mg twice daily.  Will order this, if not cost affordable she will notify us and we will consider switching to Xarelto or other anticoagulants.  Return to clinic in 6 months for follow-up.      Relevant Medications   amLODipine (NORVASC) 10 MG tablet   carvedilol (COREG) 12.5 MG tablet   apixaban (ELIQUIS) 5 MG TABS tablet  Spent 35 minutes reviewing the diagnosis of atrial fibrillation, symptoms, management plan and optimizing hypertensive therapy and medications. Return to clinic in 6 months or as needed   History of Present Illness:    Robin Reed is a 47 y.o. female who is being seen today for follow up. Her PCP is Robin Music, NP.   Has history of hypertension, hypertriglyceridemia complicated by pancreatitis, anxiety, obesity, GERD, elevated baseline heart rates, obstructive sleep apnea recently diagnosed, started using nasal device, CT coronary angiogram in August 2022 with calcium score of 0 and no plaque or stenosis and no significant extracardiac findings.   She reached out to [place] regarding EGM from her smart watch which were concerning for atrial  fibrillation.  She reported spells on 02-23-2023 with pounding in the chest and elevated blood pressures correlated with irregular heart rate and EGM's noted atrial fibrillation with heart rates up to 143 bpm.  She had 3 tracings on September 4  consistent with atrial fibrillation that we reviewed at the office visit today once again.  They ranged about 40 minutes apart.  Following day electrogram shows sinus rhythm.  She remembers symptoms lasting all night and was unable to sleep.  Unclear from the tracings how long the episode lasted, but there were tracings time 6:23 PM and 7 PM.  Likely sustained.  scheduled her for a follow-up visit  and Event monitor. 14-day event monitor results from [facility] 02/24/2023 are available.  Results show average heart rate 90/min [ranging from 64 to 137 bpm]  Rare ventricular and supraventricular ectopy burden less than 1%.  Her 9 triggered events correlated with regular heart rate and rhythm and at times with extra beats from top chambers of the heart [supraventricular ectopy].  No major abnormalities.  No evidence of atrial fibrillation on the study.   EKG in the clinic today shows sinus rhythm heart rate 82/min, PR interval 124 ms, QRS duration 92 ms, QTc 441 ms.  Here for the visit today by herself. Mentions that she has physically and emotionally stressed due to situation at home with her daughter who is currently a Consulting civil engineer at [employer].  She is currently getting used to nasal device for CPAP after being diagnosed with obstructive sleep apnea recently.  She feels her blood pressures have improved but still not optimal.  Amlodipine dose was increased by PCP about a month ago on October 4 and home log of blood pressure readings show systolic ranging from 120s to 147W and heart rates in 80s to 90s. She feels amlodipine is causing mild symptoms of lower extremity edema at times.  Denies any further symptoms of palpitations  Does not smoke, does not drink alcohol. Lives at home with her mother who is in her 46s.  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   Dietary counseling and surveillance 01/19/2021   Elevated cholesterol with high triglycerides    Essential hypertension 01/25/2023   Gastro-esophageal reflux disease without esophagitis 01/05/2021   High triglycerides    Hyperlipidemia    Insomnia 01/19/2021   Irritable bowel syndrome with diarrhea 01/05/2021   Migraine    Pancreatitis    Pernicious anemia 01/19/2021   Pure hypertriglyceridemia 01/19/2021   Restless legs syndrome 01/19/2021   Right inguinal hernia 05/31/2016   Seasonal allergic rhinitis 01/19/2021   Tachycardia    Umbilical hernia without obstruction and without gangrene 05/31/2016   UTI (urinary tract infection)    Varicose veins of bilateral lower extremities with other complications 07/19/2022   Vitamin D deficiency 01/19/2021    Past Surgical History:  Procedure Laterality Date   ANAL SPHINCTEROPLASTY     COLONOSCOPY  05/21/2013   Very minimal sigmoid diverticulosis. Small internal hemorrhoids. Otherwise normal colon to TI.    episiotomy repair  07/2003   ESOPHAGOGASTRODUODENOSCOPY  04/02/2013   Mild gastrtiis. Otherwise normal EGD   FOOT SURGERY Right  HERNIA REPAIR  06/30/2016   umbilical and right inginual    Leep surgery  11/26/2013   Nasal Surgery     PERINEOPLASTY  01/07/2004   Right Inguinal Hernia Repair  06/30/2016   Skin Graft mouth     TONSILLECTOMY  02/19/2008   UMBILICAL HERNIA REPAIR  06/30/2016    Current Medications: Current Meds  Medication Sig   amLODipine (NORVASC) 10 MG tablet Take 10 mg by mouth daily.   apixaban (ELIQUIS) 5 MG TABS tablet Take 1 tablet (5 mg total) by mouth 2 (two) times daily.   carvedilol (COREG) 12.5 MG tablet Take 1 tablet (12.5 mg total) by mouth 2 (two) times daily.   Cholecalciferol  (VITAMIN D-3) 125 MCG (5000 UT) TABS Take 5,000 Units by mouth daily.   Coenzyme Q10 (COQ10) 100 MG CAPS Take 1 capsule by mouth daily.   cyanocobalamin (,VITAMIN B-12,) 1000 MCG/ML injection Inject 1,000 mcg into the muscle every 30 (thirty) days.   desvenlafaxine (PRISTIQ) 25 MG 24 hr tablet Take 25 mg by mouth daily.   DEXILANT 60 MG capsule TAKE ONE CAPSULE BY MOUTH DAILY   dicyclomine (BENTYL) 10 MG capsule Take 1 capsule (10 mg total) by mouth 4 (four) times daily as needed for spasms.   Docusate Calcium (STOOL SOFTENER PO) Take 1 tablet by mouth daily as needed (constipation).   drospirenone-ethinyl estradiol (YAZ) 3-0.02 MG tablet Take 1 tablet by mouth daily.   EPIPEN 2-PAK 0.3 MG/0.3ML SOAJ injection Inject 0.3 mg into the muscle as needed for anaphylaxis.   famotidine (PEPCID) 40 MG tablet Take 1 tablet (40 mg total) by mouth at bedtime.   fenofibrate 160 MG tablet Take 160 mg by mouth daily.   Loratadine 10 MG CAPS Take 1 capsule by mouth daily.   Magnesium 500 MG TABS Take 500 mg by mouth at bedtime.   Magnesium Glycinate 100 MG CAPS Take 100 mg by mouth at bedtime.   meloxicam (MOBIC) 15 MG tablet Take 15 mg by mouth daily as needed for pain.   Multiple Vitamin (MULTIVITAMIN) tablet Take 1 tablet by mouth daily. With iron/ Unknown strenght   Omega-3 Fatty Acids (FISH OIL) 1000 MG CAPS Take 2 capsules by mouth 2 (two) times daily.   Potassium 99 MG TABS Take 1 tablet by mouth daily.   Red Yeast Rice 600 MG TABS Take 1 tablet by mouth 2 (two) times daily.   rOPINIRole (REQUIP) 2 MG tablet Take 2 mg by mouth at bedtime.   rosuvastatin (CRESTOR) 40 MG tablet Take 40 mg by mouth at bedtime.   Ubrogepant (UBRELVY) 100 MG TABS Take 100 mg by mouth as needed (Headahces).   Vibegron (GEMTESA) 75 MG TABS Take 1 tablet by mouth daily.   [DISCONTINUED] aspirin 81 MG tablet Take 81 mg by mouth at bedtime.   [DISCONTINUED] carvedilol (COREG) 6.25 MG tablet Take 1 tablet (6.25 mg total) by  mouth 2 (two) times daily.     Allergies:   Patient has no known allergies.   Social History   Socioeconomic History   Marital status: Divorced    Spouse name: Not on file   Number of children: 1   Years of education: Not on file   Highest education level: Not on file  Occupational History   Occupation: CNA  Tobacco Use   Smoking status: Never   Smokeless tobacco: Never  Vaping Use   Vaping status: Never Used  Substance and Sexual Activity   Alcohol use: Not Currently  Drug use: Never   Sexual activity: Not on file  Other Topics Concern   Not on file  Social History Narrative   Not on file   Social Determinants of Health   Financial Resource Strain: Not on file  Food Insecurity: Not on file  Transportation Needs: Not on file  Physical Activity: Not on file  Stress: Not on file  Social Connections: Not on file     Family History: The patient's family history includes COPD in her father; Cancer in her father; Clotting disorder in her mother; Colon cancer in her paternal grandmother; Colonic polyp in her father; Congestive Heart Failure in her father; High blood pressure in her brother, father, and mother; Lung cancer in her father; Throat cancer in her father. There is no history of Esophageal cancer or Breast cancer. ROS:   Please see the history of present illness.    All 14 point review of systems negative except as described per history of present illness.  EKGs/Labs/Other Studies Reviewed:    The following studies were reviewed today:   EKG:       Recent Labs: No results found for requested labs within last 365 days.  Recent Lipid Panel No results found for: "CHOL", "TRIG", "HDL", "CHOLHDL", "VLDL", "LDLCALC", "LDLDIRECT"  Physical Exam:    VS:  BP (!) 138/97 (BP Location: Left Arm, Patient Position: Sitting, Cuff Size: Large)   Pulse 82   Ht 5\' 8"  (1.727 m)   Wt 284 lb (128.8 kg)   SpO2 98%   BMI 43.18 kg/m     Wt Readings from Last 3  Encounters:  04/27/23 284 lb (128.8 kg)  02/09/23 284 lb 6.4 oz (129 kg)  01/25/23 284 lb 3.2 oz (128.9 kg)     GENERAL:  Well nourished, well developed in no acute distress NECK: No JVD; No carotid bruits CARDIAC: RRR, S1 and S2 present, no murmurs, no rubs, no gallops CHEST:  Clear to auscultation without rales, wheezing or rhonchi  Extremities: Trace ankle pitting pedal edema. Pulses bilaterally symmetric with radial 2+ NEUROLOGIC:  Alert and oriented x 3  Medication Adjustments/Labs and Tests Ordered: Current medicines are reviewed at length with the patient today.  Concerns regarding medicines are outlined above.  Orders Placed This Encounter  Procedures   EKG 12-Lead   Meds ordered this encounter  Medications   carvedilol (COREG) 12.5 MG tablet    Sig: Take 1 tablet (12.5 mg total) by mouth 2 (two) times daily.    Dispense:  180 tablet    Refill:  3   apixaban (ELIQUIS) 5 MG TABS tablet    Sig: Take 1 tablet (5 mg total) by mouth 2 (two) times daily.    Dispense:  60 tablet    Refill:  11    Signed, Aubree Doody reddy Kinzleigh Kandler, MD, MPH, [facility]. 04/27/2023 3:04 PM    [facility]

## 2023-04-27 NOTE — Assessment & Plan Note (Signed)
Suboptimal control. Titrate up carvedilol dose to 12.5 mg twice daily. Continue amlodipine 10 mg once daily.  If she continues to have symptoms of lower extremity swelling, will consider titrating down amlodipine while we continue to titrate up carvedilol dose or add other antihypertensive medication such as losartan.

## 2023-04-27 NOTE — Assessment & Plan Note (Addendum)
No further episodes. Remains in sinus rhythm today. Was symptomatic during the episode on September 4.  Reviewed potential triggers and risk factors for A-fib including physical emotional stress, obesity. Recommended regular exercise and dietary modification to target weight loss. If recurrent episodes of A-fib, can further consider rhythm control with antiarrhythmics versus ablation. She is able to consistently check further rhythm using her smart watch and will notify us if she has any breakthrough episodes.  The patient's CHA2DS2-VASc score is 2, indicating a 2.2% annual risk of stroke. Discussed her stroke risk at length. We reviewed options for anticoagulation for stroke prophylaxis in the setting.  She is aware of A-fib and stroke risk with a parents being on Eliquis for anticoagulation. Given this we will recommend anticoagulation for stroke prophylaxis. There is no options for anticoagulation reviewed. She is agreeable to proceed with Eliquis 5 mg twice daily.  Will order this, if not cost affordable she will notify us and we will consider switching to Xarelto or other anticoagulants.  Return to clinic in 6 months for follow-up.

## 2023-04-27 NOTE — Patient Instructions (Signed)
Medication Instructions: Your physician has recommended you make the following change in your medication:  Start Eliquis 5 mg two times daily Increase Carvedilol to 12.5 mg two times daily   *If you need a refill on your cardiac medications before your next appointment, please call your pharmacy*   Lab Work: NONE If you have labs (blood work) drawn today and your tests are completely normal, you will receive your results only by: MyChart Message (if you have MyChart) OR A paper copy in the mail If you have any lab test that is abnormal or we need to change your treatment, we will call you to review the results.   Testing/Procedures: NONE   Follow-Up: At [facility], you and your health needs are our priority.  As part of our continuing mission to provide you with exceptional heart care, we have created designated Provider Care Teams.  These Care Teams include your primary Cardiologist (physician) and Advanced Practice Providers (APPs -  Physician Assistants and Nurse Practitioners) who all work together to provide you with the care you need, when you need it.  We recommend signing up for the patient portal called "MyChart".  Sign up information is provided on this After Visit Summary.  MyChart is used to connect with patients for Virtual Visits (Telemedicine).  Patients are able to view lab/test results, encounter notes, upcoming appointments, etc.  Non-urgent messages can be sent to your provider as well.   To learn more about what you can do with MyChart, go to [email or web address].    Your next appointment:   6 month(s)  Provider:   Huntley Dec, MD    Other Instructions

## 2023-05-12 ENCOUNTER — Other Ambulatory Visit: Payer: Self-pay | Admitting: Allergy and Immunology

## 2023-05-14 LAB — LAB REPORT - SCANNED: EGFR: 60

## 2023-05-18 ENCOUNTER — Other Ambulatory Visit: Payer: Self-pay | Admitting: Gastroenterology

## 2023-05-18 ENCOUNTER — Ambulatory Visit: Payer: Commercial Managed Care - PPO

## 2023-05-18 VITALS — BP 138/90 | HR 76 | Ht 68.0 in | Wt 283.0 lb

## 2023-05-18 DIAGNOSIS — I480 Paroxysmal atrial fibrillation: Secondary | ICD-10-CM

## 2023-05-18 DIAGNOSIS — R600 Localized edema: Secondary | ICD-10-CM | POA: Insufficient documentation

## 2023-05-18 HISTORY — DX: Localized edema: R60.0

## 2023-05-18 MED ORDER — DILTIAZEM HCL ER COATED BEADS 180 MG PO CP24: 180.0000 mg | ORAL_CAPSULE | Freq: Every day | ORAL | 3 refills | Status: DC

## 2023-05-18 NOTE — Progress Notes (Addendum)
Cardiology Consultation:    Date:  05/18/2023   ID:  Robin Reed, DOB January 16, 1976, MRN [id number]  PCP:  Robin Music, NP  Cardiologist:  Robin Corporal Gem Conkle, MD   Referring MD: Robin Music, NP   Chief Complaint  Patient presents with   Follow-up     ASSESSMENT AND PLAN:   Robin Reed 47 year old woman with history of smart watch identified paroxysmal atrial fibrillation, hypertension, normal CT coronary angiogram August 2022, obesity, hypertriglyceridemia, GERD, obstructive sleep apnea, elevated baseline heart rates  Problem List Items Addressed This Visit     Paroxysmal atrial fibrillation (HCC), diagnosed 02-23-2023 by electrogram using smart watch at home; CHA2DS2-VASc score 2 - Primary    Tolerating her current medication Eliquis 5 mg twice daily. Also remains on carvedilol 12.5 mg twice daily. Will switch her from amlodipine 10 mg once daily to Cardizem 180 mg once daily to help both with blood pressure control and to see if this would have the added benefit of reducing her A-fib episodes.  Recent A-fib episode possibly have been triggered in the setting of drinking ice cold water.  Reviewed further options for rhythm control with antiarrhythmics versus ablation and further review with electrophysiologist. She is continuing to follow-up with electrophysiologist. Will refer to Dr. Elberta Fortis.        Relevant Medications   diltiazem (CARDIZEM CD) 180 MG 24 hr capsule   Other Relevant Orders   ECHOCARDIOGRAM COMPLETE   Ambulatory referral to Cardiac Electrophysiology   Bilateral lower extremity edema    Reports intermittent mild bilateral lower extremity edema and ongoing use of Lasix as needed. proBNP elevated recently likely in the setting of paroxysmal A-fib with rapid ventricular rate episodes.  Currently without any signs of fluid overload. She has been switched to Lasix 20 mg once daily.  Will obtain transthoracic echocardiogram to review any interval  change in her biventricular function or any other valve disease in comparison to prior echocardiogram from June 2022.        Return to clinic in 6 months.   Addendum 06/17/2023: echocardiogram results from 05/27/2023 done at [facility] normal LV systolic and diastolic function.  Left ventricular ejection fraction 55 to 60% was normal.  Visualization somewhat limited to assess all the valve structures but there was no major obvious abnormality noted. In comparison prior echocardiogram from 12/05/2020 at [facility] noted LVEF 60 to 65%.  No significant change. History of Present Illness:    Robin Reed is a 47 y.o. female who is being seen today for follow-up, last visit with me in the office was 04/27/2023. PCP is Robin Music, NP.  Has history of hypertension, obesity, hypertriglyceridemia, GERD, obstructive sleep apnea uses nasal device, elevated baseline heart rates, no significant abnormality on CT coronary angiogram August 2022 [calcium score 0, no plaque or stenosis and no significant extracardiac findings], normal biventricular function on echocardiogram August 2022 has since been diagnosed with paroxysmal atrial fibrillation based on smart watch reading at home 02/23/2023 and no further burden of A-fib on follow-up 14-day study with Zio patch.  Reports that Friday night at home she noticed to her heart rates to be elevated associated with shortness of breath.  This started soon after she drank ice cold water.  Her smart watch confirmed atrial fibrillation going with heart rates 130s to 140s.  She tried to wait it out overnight.  Saturday morning she was still in A-fib and headed to the ER at [facility] She felt her  symptoms resolved by the time she was walking into the ER.  In the ER she was mildly hypertensive with blood pressures 140 over 96 mmHg, EKG showed sinus rhythm with heart rate 89/min.  proBNP elevated 1850.  Troponin I was less than  0.01.  She was given a dose of Lasix 40 mg one-time and prescribed this to be used at night given her elevated proBNP levels.  She tells me that she was using Lasix 20 mg once a day at home at baseline on a regular basis depending on any ankle edema.  She has been taking it once a day ever since her ER visit.  Currently denies any further symptoms. She wishes to proceed with rhythm control options for atrial fibrillation. Blood pressure log at home noted systolic blood pressure ranging from 110s to 130s. Blood pressures today in the office mildly uncontrolled which she says is unusual. She is tolerating her current blood pressure medications carvedilol and amlodipine well.  Does not smoke or drink alcohol.  Last echocardiogram available to review is from [facility] from June 2022 LVEF 60 to 65%.  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   Dietary counseling and surveillance 01/19/2021   Elevated cholesterol with high triglycerides    Essential hypertension 01/25/2023   Gastro-esophageal reflux disease without esophagitis 01/05/2021   High triglycerides    Hyperlipidemia    Insomnia 01/19/2021   Irritable bowel syndrome with diarrhea 01/05/2021   Migraine    Pancreatitis    Pernicious anemia 01/19/2021   Pure hypertriglyceridemia 01/19/2021   Restless legs syndrome 01/19/2021   Right inguinal hernia 05/31/2016   Seasonal allergic rhinitis 01/19/2021   Tachycardia    Umbilical hernia without obstruction and without gangrene 05/31/2016   UTI (urinary tract infection)    Varicose  veins of bilateral lower extremities with other complications 07/19/2022   Vitamin D deficiency 01/19/2021    Past Surgical History:  Procedure Laterality Date   ANAL SPHINCTEROPLASTY     COLONOSCOPY  05/21/2013   Very minimal sigmoid diverticulosis. Small internal hemorrhoids. Otherwise normal colon to TI.    episiotomy repair  07/2003   ESOPHAGOGASTRODUODENOSCOPY  04/02/2013   Mild gastrtiis. Otherwise normal EGD   FOOT SURGERY Right    HERNIA REPAIR  06/30/2016   umbilical and right inginual    Leep surgery  11/26/2013   Nasal Surgery     PERINEOPLASTY  01/07/2004   Right Inguinal Hernia Repair  06/30/2016   Skin Graft mouth     TONSILLECTOMY  02/19/2008   UMBILICAL HERNIA REPAIR  06/30/2016    Current Medications: Current Meds  Medication Sig   apixaban (ELIQUIS) 5 MG TABS tablet Take 1 tablet (5 mg total) by mouth 2 (two) times daily.   carvedilol (COREG) 12.5 MG tablet Take 1 tablet (12.5 mg total) by mouth 2 (two) times daily.   Cholecalciferol (VITAMIN D-3) 125 MCG (5000 UT) TABS Take 5,000 Units by mouth daily.   Coenzyme Q10 (COQ10) 100 MG CAPS Take 1 capsule by mouth daily.   cyanocobalamin (,VITAMIN B-12,) 1000 MCG/ML injection Inject 1,000 mcg into the muscle every  30 (thirty) days.   desvenlafaxine (PRISTIQ) 50 MG 24 hr tablet Take 50 mg by mouth daily.   DEXILANT 60 MG capsule TAKE ONE CAPSULE BY MOUTH DAILY   dicyclomine (BENTYL) 10 MG capsule Take 1 capsule (10 mg total) by mouth 4 (four) times daily as needed for spasms.   diltiazem (CARDIZEM CD) 180 MG 24 hr capsule Take 1 capsule (180 mg total) by mouth daily.   Docusate Calcium (STOOL SOFTENER PO) Take 1 tablet by mouth daily as needed (constipation).   drospirenone-ethinyl estradiol (YAZ) 3-0.02 MG tablet Take 1 tablet by mouth daily.   EPIPEN 2-PAK 0.3 MG/0.3ML SOAJ injection Inject 0.3 mg into the muscle as needed for anaphylaxis.   famotidine (PEPCID) 40 MG tablet Take 1 tablet (40 mg total) by mouth at  bedtime.   fenofibrate 160 MG tablet Take 160 mg by mouth daily.   Loratadine 10 MG CAPS Take 1 capsule by mouth daily.   Magnesium 500 MG TABS Take 500 mg by mouth at bedtime.   Magnesium Glycinate 100 MG CAPS Take 100 mg by mouth at bedtime.   meloxicam (MOBIC) 15 MG tablet Take 15 mg by mouth daily as needed for pain.   Multiple Vitamin (MULTIVITAMIN) tablet Take 1 tablet by mouth daily. With iron/ Unknown strenght   Omega-3 Fatty Acids (FISH OIL) 1000 MG CAPS Take 2 capsules by mouth 2 (two) times daily.   Potassium 99 MG TABS Take 1 tablet by mouth daily.   Red Yeast Rice 600 MG TABS Take 1 tablet by mouth 2 (two) times daily.   rOPINIRole (REQUIP) 2 MG tablet Take 2 mg by mouth at bedtime.   rosuvastatin (CRESTOR) 40 MG tablet Take 40 mg by mouth at bedtime.   Ubrogepant (UBRELVY) 100 MG TABS Take 100 mg by mouth as needed (Headahces).   Vibegron (GEMTESA) 75 MG TABS Take 1 tablet by mouth daily.   [DISCONTINUED] amLODipine (NORVASC) 10 MG tablet Take 10 mg by mouth daily.   [DISCONTINUED] DEXILANT 60 MG capsule TAKE ONE CAPSULE BY MOUTH DAILY     Allergies:   Patient has no known allergies.   Social History   Socioeconomic History   Marital status: Divorced    Spouse name: Not on file   Number of children: 1   Years of education: Not on file   Highest education level: Not on file  Occupational History   Occupation: CNA  Tobacco Use   Smoking status: Never   Smokeless tobacco: Never  Vaping Use   Vaping status: Never Used  Substance and Sexual Activity   Alcohol use: Not Currently   Drug use: Never   Sexual activity: Not on file  Other Topics Concern   Not on file  Social History Narrative   Not on file   Social Determinants of Health   Financial Resource Strain: Not on file  Food Insecurity: Not on file  Transportation Needs: Not on file  Physical Activity: Not on file  Stress: Not on file  Social Connections: Not on file     Family History: The patient's  family history includes COPD in her father; Cancer in her father; Clotting disorder in her mother; Colon cancer in her paternal grandmother; Colonic polyp in her father; Congestive Heart Failure in her father; High blood pressure in her brother, father, and mother; Lung cancer in her father; Throat cancer in her father. There is no history of Esophageal cancer or Breast cancer. ROS:   Please see the history of  present illness.    All 14 point review of systems negative except as described per history of present illness.  EKGs/Labs/Other Studies Reviewed:    The following studies were reviewed today:   EKG:       Recent Labs: No results found for requested labs within last 365 days.  Recent Lipid Panel No results found for: "CHOL", "TRIG", "HDL", "CHOLHDL", "VLDL", "LDLCALC", "LDLDIRECT"  Physical Exam:    VS:  BP (!) 138/90 (BP Location: Left Arm, Patient Position: Sitting, Cuff Size: Normal)   Pulse 76   Ht 5\' 8"  (1.727 m)   Wt 283 lb (128.4 kg)   SpO2 99%   BMI 43.03 kg/m     Wt Readings from Last 3 Encounters:  05/18/23 283 lb (128.4 kg)  04/27/23 284 lb (128.8 kg)  02/09/23 284 lb 6.4 oz (129 kg)     GENERAL:  Well nourished, well developed in no acute distress CARDIAC: RRR, S1 and S2 present, no murmurs, no rubs, no gallops CHEST:  Clear to auscultation without rales, wheezing or rhonchi  Extremities: No pitting pedal edema. Pulses bilaterally symmetric with radial 2+ and dorsalis pedis 2+ NEUROLOGIC:  Alert and oriented x 3  Medication Adjustments/Labs and Tests Ordered: Current medicines are reviewed at length with the patient today.  Concerns regarding medicines are outlined above.  Orders Placed This Encounter  Procedures   Ambulatory referral to Cardiac Electrophysiology   ECHOCARDIOGRAM COMPLETE   Meds ordered this encounter  Medications   diltiazem (CARDIZEM CD) 180 MG 24 hr capsule    Sig: Take 1 capsule (180 mg total) by mouth daily.    Dispense:  90  capsule    Refill:  3    Signed, Phoebie Shad reddy Hellon Vaccarella, MD, MPH, [facility]. 05/18/2023 2:09 PM    [facility]

## 2023-05-18 NOTE — Assessment & Plan Note (Signed)
Tolerating her current medication Eliquis 5 mg twice daily. Also remains on carvedilol 12.5 mg twice daily. Will switch her from amlodipine 10 mg once daily to Cardizem 180 mg once daily to help both with blood pressure control and to see if this would have the added benefit of reducing her A-fib episodes.  Recent A-fib episode possibly have been triggered in the setting of drinking ice cold water.  Reviewed further options for rhythm control with antiarrhythmics versus ablation and further review with electrophysiologist. She is continuing to follow-up with electrophysiologist. Will refer to Dr. Elberta Fortis.

## 2023-05-18 NOTE — Patient Instructions (Signed)
Medication Instructions:  Your physician has recommended you make the following change in your medication:   STOP: Amlodipine START: Cardizem 180 mg daily  *If you need a refill on your cardiac medications before your next appointment, please call your pharmacy*   Lab Work: None If you have labs (blood work) drawn today and your tests are completely normal, you will receive your results only by: MyChart Message (if you have MyChart) OR A paper copy in the mail If you have any lab test that is abnormal or we need to change your treatment, we will call you to review the results.   Testing/Procedures: Your physician has requested that you have an echocardiogram. Echocardiography is a painless test that uses sound waves to create images of your heart. It provides your doctor with information about the size and shape of your heart and how well your heart's chambers and valves are working. This procedure takes approximately one hour. There are no restrictions for this procedure. Please do NOT wear cologne, perfume, aftershave, or lotions (deodorant is allowed). Please arrive 15 minutes prior to your appointment time.  Please note: We ask at that you not bring children with you during ultrasound (echo/ vascular) testing. Due to room size and safety concerns, children are not allowed in the ultrasound rooms during exams. Our front office staff cannot provide observation of children in our lobby area while testing is being conducted. An adult accompanying a patient to their appointment will only be allowed in the ultrasound room at the discretion of the ultrasound technician under special circumstances. We apologize for any inconvenience.    Follow-Up: At [facility], you and your health needs are our priority.  As part of our continuing mission to provide you with exceptional heart care, we have created designated Provider Care Teams.  These Care Teams include your primary Cardiologist  (physician) and Advanced Practice Providers (APPs -  Physician Assistants and Nurse Practitioners) who all work together to provide you with the care you need, when you need it.  We recommend signing up for the patient portal called "MyChart".  Sign up information is provided on this After Visit Summary.  MyChart is used to connect with patients for Virtual Visits (Telemedicine).  Patients are able to view lab/test results, encounter notes, upcoming appointments, etc.  Non-urgent messages can be sent to your provider as well.   To learn more about what you can do with MyChart, go to [email or web address].    Your next appointment:   6 month(s)  Provider:   Huntley Dec, MD    Other Instructions None

## 2023-05-18 NOTE — Assessment & Plan Note (Signed)
Reports intermittent mild bilateral lower extremity edema and ongoing use of Lasix as needed. proBNP elevated recently likely in the setting of paroxysmal A-fib with rapid ventricular rate episodes.  Currently without any signs of fluid overload. She has been switched to Lasix 20 mg once daily.  Will obtain transthoracic echocardiogram to review any interval change in her biventricular function or any other valve disease in comparison to prior echocardiogram from June 2022.

## 2023-05-24 ENCOUNTER — Other Ambulatory Visit: Payer: Self-pay | Admitting: Allergy and Immunology

## 2023-05-27 DIAGNOSIS — I480 Paroxysmal atrial fibrillation: Secondary | ICD-10-CM | POA: Diagnosis not present

## 2023-06-20 ENCOUNTER — Telehealth: Payer: Self-pay

## 2023-06-20 NOTE — Telephone Encounter (Signed)
LMCM per Dr. Madireddy's note with normal Echo results.

## 2023-06-23 ENCOUNTER — Other Ambulatory Visit: Payer: Self-pay | Admitting: Allergy and Immunology

## 2023-07-18 ENCOUNTER — Encounter: Payer: Self-pay | Admitting: Cardiology

## 2023-07-18 ENCOUNTER — Ambulatory Visit: Payer: Commercial Managed Care - PPO | Attending: Cardiology | Admitting: Cardiology

## 2023-07-18 VITALS — BP 134/80 | HR 92 | Ht 68.0 in | Wt 288.4 lb

## 2023-07-18 DIAGNOSIS — G4733 Obstructive sleep apnea (adult) (pediatric): Secondary | ICD-10-CM

## 2023-07-18 DIAGNOSIS — I480 Paroxysmal atrial fibrillation: Secondary | ICD-10-CM

## 2023-07-18 DIAGNOSIS — I10 Essential (primary) hypertension: Secondary | ICD-10-CM

## 2023-07-18 NOTE — Progress Notes (Signed)
Electrophysiology Office Note:   Date:  07/18/2023  ID:  Robin Reed, DOB [birth year], MRN [id number]  Primary Cardiologist: Marlyn Corporal Madireddy, MD Primary Heart Failure: None Electrophysiologist: Shi Blankenship Jorja Loa, MD      History of Present Illness:   Robin Reed is a 48 y.o. female with h/o hypertension, obesity, hyperlipidemia, sleep apnea, atrial fibrillation seen today for  for Electrophysiology evaluation of atrial fibrillation at the request of Sreedhar Madireddy.    She was diagnosed with atrial fibrillation due to smart watch reading on 02/23/2023.  She did well until recently, when she developed further episodes of atrial fibrillation.  Heart rates are in the 130s to 140s.  She went to the [facility] emergency room.  Symptoms resolved when she got to the emergency room.  EKG at the time showed sinus rhythm.  She has not had to go to the emergency room since November.  Apparently eating ice and other cold things have triggered her atrial fibrillation in the past.  Since she has avoided this, she has not had any further episodes.  She is currently feeling well.  She does state that she weighs approximately 50 pounds more than she has in the past.  She would like to lose weight.  She is trying to be compliant with her CPAP.  Review of systems complete and found to be negative unless listed in HPI.   EP Information / Studies Reviewed:    EKG is ordered today. Personal review as below.  EKG Interpretation Date/Time:  Monday July 18 2023 14:05:43 EST Ventricular Rate:  88 PR Interval:  138 QRS Duration:  84 QT Interval:  380 QTC Calculation: 459 R Axis:   26  Text Interpretation: Normal sinus rhythm Normal ECG When compared with ECG of 27-Apr-2023 14:02, No significant change was found Confirmed by Pinchus Weckwerth ([id number]) on 07/18/2023 2:13:53 PM     Risk Assessment/Calculations:    CHA2DS2-VASc Score = 2   This indicates a 2.2% annual risk of stroke. The patient's  score is based upon: CHF History: 0 HTN History: 1 Diabetes History: 0 Stroke History: 0 Vascular Disease History: 0 Age Score: 0 Gender Score: 1              Physical Exam:   VS:  BP 134/80   Pulse 92   Ht 5\' 8"  (1.727 m)   Wt 288 lb 6.4 oz (130.8 kg)   SpO2 94%   BMI 43.85 kg/m    Wt Readings from Last 3 Encounters:  07/18/23 288 lb 6.4 oz (130.8 kg)  05/18/23 283 lb (128.4 kg)  04/27/23 284 lb (128.8 kg)     GEN: Well nourished, well developed in no acute distress NECK: No JVD; No carotid bruits CARDIAC: Regular rate and rhythm, no murmurs, rubs, gallops RESPIRATORY:  Clear to auscultation without rales, wheezing or rhonchi  ABDOMEN: Soft, non-tender, non-distended EXTREMITIES:  No edema; No deformity   ASSESSMENT AND PLAN:    1.  Paroxysmal atrial fibrillation: Currently on carvedilol and diltiazem.  She has obvious triggers such as eating ice and other cold things.  She Conor Lata avoid this for now.  She does not have any further episodes of atrial fibrillation, would continue with current management.  I would be happy to see her back if she requires any further therapy.  I did tell her that her BMI needs to get closer to 40 before we Jeorgia Helming consider ablation.  2.  Obstructive sleep apnea: CPAP compliance encouraged  3.  Obesity:  Had a long discussion with the patient on diet and exercise.  Encouraged 30 minutes/day of moderate level exercise.  Informed her that losing 10% of her body weight Burnis Kaser help improve her A-fib control.  She Towana Stenglein try and get down to a BMI less than 40 to be a better candidate for ablation.  Discussed with primary cardiology  Follow up with Dr. Elberta Fortis  PRN   Signed, Kamyra Schroeck Jorja Loa, MD

## 2023-07-18 NOTE — Patient Instructions (Signed)
Medication Instructions:  Your physician has recommended you make the following change in your medication:  STOP Eliquis   *If you need a refill on your cardiac medications before your next appointment, please call your pharmacy*   Lab Work: None ordered   Testing/Procedures: None ordered   Follow-Up: At [facility], you and your health needs are our priority.  As part of our continuing mission to provide you with exceptional heart care, we have created designated Provider Care Teams.  These Care Teams include your primary Cardiologist (physician) and Advanced Practice Providers (APPs -  Physician Assistants and Nurse Practitioners) who all work together to provide you with the care you need, when you need it.  Your next appointment:   as  needed  The format for your next appointment:   In Person  Provider:   Loman Brooklyn, MD    Thank you for choosing [facility]!!   Dory Horn, RN [phone]

## 2023-08-09 ENCOUNTER — Other Ambulatory Visit: Payer: Self-pay

## 2023-08-09 MED ORDER — DICYCLOMINE HCL 10 MG PO CAPS: 10.0000 mg | ORAL_CAPSULE | Freq: Four times a day (QID) | ORAL | 1 refills | Status: DC | PRN

## 2023-09-05 ENCOUNTER — Telehealth: Payer: Self-pay | Admitting: Cardiology

## 2023-09-05 NOTE — Telephone Encounter (Signed)
*  STAT* If patient is at the pharmacy, call can be transferred to refill team.   1. Which medications need to be refilled? (please list name of each medication and dose if known) diltiazem (CARDIZEM CD) 180 MG 24 hr capsule  carvedilol (COREG) 12.5 MG tablet   2. Would you like to learn more about the convenience, safety, & potential cost savings by using the [facility]? No   3. Are you open to using the [facility] (Type [facility]. ) No   4. Which pharmacy/location (including street and city if local pharmacy) is medication to be sent to? [facility]    5. Do they need a 30 day or 90 day supply? 90 day

## 2023-09-06 MED ORDER — DILTIAZEM HCL ER COATED BEADS 180 MG PO CP24
180.0000 mg | ORAL_CAPSULE | Freq: Every day | ORAL | 1 refills | Status: AC
Start: 2023-09-06 — End: ?

## 2023-09-06 NOTE — Telephone Encounter (Signed)
 Refill of Diltiazem 180 mg sent to [facility].

## 2023-09-13 ENCOUNTER — Other Ambulatory Visit: Payer: Self-pay | Admitting: Allergy and Immunology

## 2023-10-11 ENCOUNTER — Other Ambulatory Visit: Payer: Self-pay | Admitting: Allergy and Immunology

## 2023-11-02 ENCOUNTER — Ambulatory Visit: Admitting: Podiatry

## 2023-11-02 DIAGNOSIS — E119 Type 2 diabetes mellitus without complications: Secondary | ICD-10-CM

## 2023-11-02 NOTE — Progress Notes (Signed)
 Chief Complaint  Patient presents with   Diabetes    NP with a new DM DX. Here today for diabetic foot exam with out nail care. Last A1c 6.6 a month ago. Takes ASA 81.   HPI: 48 y.o. female presents today for a diabetic foot check.  She is not requesting nail care today.  Her diabetes is managed with oral hypoglycemic agents and Mounjaro.  Denies burning or numbness to the feet.  Patient works in the [employer].  She does note some issues at the end of the day with cramping of the muscles in the feet and legs.  She notes that she does wear good sneakers.  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   Dietary counseling and surveillance 01/19/2021   Elevated cholesterol with high triglycerides    Essential hypertension 01/25/2023   Gastro-esophageal reflux disease without esophagitis 01/05/2021   High triglycerides    Hyperlipidemia    Insomnia 01/19/2021   Irritable bowel syndrome with diarrhea 01/05/2021   Migraine    Pancreatitis    Pernicious anemia 01/19/2021   Pure hypertriglyceridemia 01/19/2021   Restless legs syndrome 01/19/2021   Right inguinal hernia 05/31/2016   Seasonal allergic rhinitis 01/19/2021   Tachycardia    Umbilical hernia without obstruction and without gangrene 05/31/2016   UTI (urinary tract infection)    Varicose veins of bilateral lower extremities with other complications 07/19/2022   Vitamin D deficiency 01/19/2021   Past Surgical History:  Procedure Laterality Date   ANAL SPHINCTEROPLASTY     COLONOSCOPY  05/21/2013   Very minimal sigmoid diverticulosis. Small internal hemorrhoids. Otherwise normal colon to TI.    episiotomy repair  07/2003   ESOPHAGOGASTRODUODENOSCOPY  04/02/2013   Mild gastrtiis. Otherwise normal EGD   FOOT  SURGERY Right    HERNIA REPAIR  06/30/2016   umbilical and right inginual    Leep surgery  11/26/2013   Nasal Surgery     PERINEOPLASTY  01/07/2004   Right Inguinal Hernia Repair  06/30/2016   Skin Graft mouth     TONSILLECTOMY  02/19/2008   UMBILICAL HERNIA REPAIR  06/30/2016   No Known Allergies   Physical Exam: General: The patient is alert and oriented x3 in no acute distress.  Dermatology: Skin is warm, dry and supple bilateral lower extremities. Interspaces are clear of maceration and debris.    Vascular: Palpable pedal pulses bilaterally. Capillary refill within normal limits.  No appreciable edema.  No erythema or calor.  Neurological: Light touch sensation grossly intact bilateral feet.  Vibratory sensation slightly diminished in the forefoot at the first MPJ which is slightly worse in the left than the right foot.  Musculoskeletal Exam: Decreased arch height bilateral.  Positive windlass mechanism.  Assessment/Plan of Care: 1. Encounter for diabetic foot exam (HCC)   2. Type 2 diabetes mellitus without complication, without long-term current use of insulin (HCC)     Discussed clinical findings with patient today.  Recommended power step arch supports in the future, to place in her sneakers when working, to support the midfoot better.  She may be getting some fatigue of the supporting muscles with standing/shift work.  Reviewed the importance of proper blood sugar control to decrease chance for peripheral neuropathy.  Also recommended magnesium 500 mg nightly to  possibly help minimize the foot and leg cramping at night.  Follow-up annually  Joe Murders, DPM, FACFAS [facility]     [address] [place], [place] [address]                Office [phone]  Fax [phone]

## 2023-11-07 ENCOUNTER — Encounter: Payer: Self-pay | Admitting: Podiatry

## 2023-11-07 DIAGNOSIS — E119 Type 2 diabetes mellitus without complications: Secondary | ICD-10-CM

## 2023-11-07 DIAGNOSIS — G4730 Sleep apnea, unspecified: Secondary | ICD-10-CM

## 2023-11-07 HISTORY — DX: Sleep apnea, unspecified: G47.30

## 2023-11-07 HISTORY — DX: Type 2 diabetes mellitus without complications: E11.9

## 2023-11-08 ENCOUNTER — Other Ambulatory Visit: Payer: Self-pay | Admitting: Allergy and Immunology

## 2023-11-09 ENCOUNTER — Other Ambulatory Visit: Payer: Self-pay

## 2023-11-10 DIAGNOSIS — M79673 Pain in unspecified foot: Secondary | ICD-10-CM

## 2023-11-21 ENCOUNTER — Ambulatory Visit

## 2023-12-06 ENCOUNTER — Other Ambulatory Visit: Payer: Self-pay | Admitting: Allergy and Immunology

## 2023-12-06 NOTE — Telephone Encounter (Signed)
 Spoke to patient and will mail off patient assistant application and she will mail or fax back to the company.

## 2023-12-21 NOTE — Telephone Encounter (Signed)
 Patient assistance approved until 12-19-2024

## 2024-01-03 ENCOUNTER — Other Ambulatory Visit: Payer: Self-pay | Admitting: Allergy and Immunology

## 2024-03-27 ENCOUNTER — Other Ambulatory Visit: Payer: Self-pay

## 2024-03-27 MED ORDER — CARVEDILOL 12.5 MG PO TABS: 12.5000 mg | ORAL_TABLET | Freq: Two times a day (BID) | ORAL | 1 refills | Status: AC

## 2024-10-31 ENCOUNTER — Ambulatory Visit: Admitting: Podiatry
